# Patient Record
Sex: Female | Born: 1975 | Race: White | Hispanic: No | Marital: Married | Smoking: Former smoker
Health system: Southern US, Community
[De-identification: ages and names within clinical notes are randomized; demographics above are authoritative.]

## PROBLEM LIST (undated history)

## (undated) DIAGNOSIS — T7840XA Allergy, unspecified, initial encounter: Secondary | ICD-10-CM

## (undated) DIAGNOSIS — B379 Candidiasis, unspecified: Secondary | ICD-10-CM

## (undated) DIAGNOSIS — N6452 Nipple discharge: Secondary | ICD-10-CM

## (undated) DIAGNOSIS — F419 Anxiety disorder, unspecified: Secondary | ICD-10-CM

## (undated) DIAGNOSIS — T753XXA Motion sickness, initial encounter: Secondary | ICD-10-CM

## (undated) DIAGNOSIS — Z8619 Personal history of other infectious and parasitic diseases: Secondary | ICD-10-CM

## (undated) DIAGNOSIS — IMO0002 Reserved for concepts with insufficient information to code with codable children: Secondary | ICD-10-CM

## (undated) DIAGNOSIS — Z8659 Personal history of other mental and behavioral disorders: Secondary | ICD-10-CM

## (undated) DIAGNOSIS — I10 Essential (primary) hypertension: Secondary | ICD-10-CM

## (undated) DIAGNOSIS — Z789 Other specified health status: Secondary | ICD-10-CM

## (undated) DIAGNOSIS — Z800 Family history of malignant neoplasm of digestive organs: Secondary | ICD-10-CM

## (undated) HISTORY — DX: Family history of malignant neoplasm of digestive organs: Z80.0

## (undated) HISTORY — DX: Personal history of other mental and behavioral disorders: Z86.59

## (undated) HISTORY — DX: Allergy, unspecified, initial encounter: T78.40XA

## (undated) HISTORY — DX: Essential (primary) hypertension: I10

## (undated) HISTORY — PX: BREAST SURGERY: SHX581

## (undated) HISTORY — DX: Personal history of other infectious and parasitic diseases: Z86.19

## (undated) HISTORY — DX: Reserved for concepts with insufficient information to code with codable children: IMO0002

## (undated) HISTORY — PX: NO PAST SURGERIES: SHX2092

## (undated) HISTORY — DX: Anxiety disorder, unspecified: F41.9

## (undated) HISTORY — DX: Candidiasis, unspecified: B37.9

---

## 1998-01-16 DIAGNOSIS — IMO0002 Reserved for concepts with insufficient information to code with codable children: Secondary | ICD-10-CM

## 1998-01-16 DIAGNOSIS — R87619 Unspecified abnormal cytological findings in specimens from cervix uteri: Secondary | ICD-10-CM

## 1998-01-16 HISTORY — DX: Unspecified abnormal cytological findings in specimens from cervix uteri: R87.619

## 1998-01-16 HISTORY — DX: Reserved for concepts with insufficient information to code with codable children: IMO0002

## 2008-09-11 ENCOUNTER — Other Ambulatory Visit: Admission: RE | Admit: 2008-09-11 | Discharge: 2008-09-11 | Payer: Self-pay | Admitting: Family Medicine

## 2009-11-08 ENCOUNTER — Other Ambulatory Visit: Admission: RE | Admit: 2009-11-08 | Discharge: 2009-11-08 | Payer: Self-pay | Admitting: Family Medicine

## 2011-01-17 NOTE — L&D Delivery Note (Signed)
Delivery Note At 10:46 AM a viable female was delivered SVD in McRoberts position, by Dr. Estanislado Pandy (Presentation: OA ).  APGAR: 9, 9 ; weight 7 lb 0.4 oz (3185 g).   Placenta status intact, spontaneous  Anesthesia:  local Episiotomy:  Lacerations: 2nd degree perineal Suture Repair: 3.0 monocryl Est. Blood Loss (mL): 300  Mom to postpartum.  Baby to rooming in.  Sharon Beck 03/24/2011, 11:41 AM

## 2011-02-07 ENCOUNTER — Encounter: Payer: Self-pay | Admitting: *Deleted

## 2011-02-07 ENCOUNTER — Encounter: Payer: BC Managed Care – PPO | Attending: Obstetrics and Gynecology | Admitting: *Deleted

## 2011-02-07 DIAGNOSIS — Z713 Dietary counseling and surveillance: Secondary | ICD-10-CM | POA: Insufficient documentation

## 2011-02-07 DIAGNOSIS — O99810 Abnormal glucose complicating pregnancy: Secondary | ICD-10-CM | POA: Insufficient documentation

## 2011-02-07 NOTE — Progress Notes (Signed)
  Patient was seen on 02/07/2011 for Gestational Diabetes self-management appointment at the [facility]. The following learning objectives were met by the patient during this visit:   States the definition of Gestational Diabetes  States why dietary management is important in controlling blood glucose  Describes the effects each nutrient has on blood glucose levels  Demonstrates ability to create a balanced meal plan  Demonstrates carbohydrate counting   States when to check blood glucose levels  Demonstrates proper blood glucose monitoring techniques  States the effect of stress and exercise on blood glucose levels  States the importance of limiting caffeine and abstaining from alcohol and smoking  Blood glucose monitor given: One Touch Ultra Mini Lot # [id number] x Exp: 5/13 Blood glucose reading: 86  Patient instructed to monitor glucose levels: FBS: 60 - <90 2 hour: <120  *Patient received handouts:  Nutrition Diabetes and Pregnancy  Carbohydrate Counting List  Patient will be seen for follow-up as needed.

## 2011-02-07 NOTE — Patient Instructions (Signed)
Goals:  Check glucose levels per MD as instructed  Follow Gestational Diabetes Diet as instructed  Call for follow-up as needed    

## 2011-03-22 ENCOUNTER — Encounter (INDEPENDENT_AMBULATORY_CARE_PROVIDER_SITE_OTHER): Payer: BC Managed Care – PPO

## 2011-03-22 DIAGNOSIS — Z331 Pregnant state, incidental: Secondary | ICD-10-CM

## 2011-03-24 ENCOUNTER — Inpatient Hospital Stay (HOSPITAL_COMMUNITY)
Admission: AD | Admit: 2011-03-24 | Discharge: 2011-03-26 | DRG: 373 | Disposition: A | Discharge status: Home / Self Care | Payer: BC Managed Care – PPO | Source: Ambulatory Visit | Attending: Obstetrics and Gynecology | Admitting: Obstetrics and Gynecology

## 2011-03-24 ENCOUNTER — Encounter (HOSPITAL_COMMUNITY): Payer: Self-pay

## 2011-03-24 DIAGNOSIS — O99814 Abnormal glucose complicating childbirth: Secondary | ICD-10-CM

## 2011-03-24 DIAGNOSIS — IMO0001 Reserved for inherently not codable concepts without codable children: Secondary | ICD-10-CM

## 2011-03-24 HISTORY — DX: Other specified health status: Z78.9

## 2011-03-24 LAB — COMPREHENSIVE METABOLIC PANEL
BUN: 10 mg/dL (ref 6–23)
Calcium: 8.3 mg/dL — ABNORMAL LOW (ref 8.4–10.5)
GFR calc Af Amer: >90 mL/min (ref >90)
Glucose, Bld: 157 mg/dL — ABNORMAL HIGH (ref 70–99)
Total Protein: 5.8 g/dL — ABNORMAL LOW (ref 6.0–8.3)

## 2011-03-24 LAB — STREP B DNA PROBE: GBS: NEGATIVE

## 2011-03-24 LAB — CBC
HCT: 30.9 % — ABNORMAL LOW (ref 36.0–46.0)
Hemoglobin: 10.6 g/dL — ABNORMAL LOW (ref 12.0–15.0)
Hemoglobin: 10.6 g/dL — ABNORMAL LOW (ref 12.0–15.0)
MCHC: 34.3 g/dL (ref 30.0–36.0)
RBC: 3.45 MIL/uL — ABNORMAL LOW (ref 3.87–5.11)
WBC: 17 10*3/uL — ABNORMAL HIGH (ref 4.0–10.5)

## 2011-03-24 LAB — HIV ANTIBODY (ROUTINE TESTING W REFLEX): HIV: NONREACTIVE

## 2011-03-24 LAB — GLUCOSE, CAPILLARY
Glucose-Capillary: 156 mg/dL — ABNORMAL HIGH (ref 70–99)
Glucose-Capillary: 171 mg/dL — ABNORMAL HIGH (ref 70–99)

## 2011-03-24 LAB — URIC ACID: Uric Acid, Serum: 4.6 mg/dL (ref 2.4–7.0)

## 2011-03-24 LAB — LACTATE DEHYDROGENASE: LDH: 230 U/L (ref 94–250)

## 2011-03-24 LAB — ABO/RH

## 2011-03-24 LAB — ANTIBODY SCREEN: Antibody Screen: NEGATIVE

## 2011-03-24 MED ORDER — SIMETHICONE 80 MG PO CHEW: 80.0000 mg | CHEWABLE_TABLET | ORAL | Status: DC | PRN

## 2011-03-24 MED ORDER — DIBUCAINE 1 % RE OINT
1.0000 "application " | TOPICAL_OINTMENT | RECTAL | Status: DC | PRN
  Filled 2011-03-24 (×2): qty 28

## 2011-03-24 MED ORDER — FLEET ENEMA 7-19 GM/118ML RE ENEM: 1.0000 | ENEMA | RECTAL | Status: DC | PRN

## 2011-03-24 MED ORDER — OXYTOCIN 20 UNITS IN LACTATED RINGERS INFUSION - SIMPLE
125.0000 mL/h | Freq: Once | INTRAVENOUS | Status: AC
  Administered 2011-03-24: 125 mL/h via INTRAVENOUS

## 2011-03-24 MED ORDER — SODIUM CHLORIDE 0.9 % IV SOLN: 250.0000 mL | INTRAVENOUS | Status: DC | PRN

## 2011-03-24 MED ORDER — HYDROCORTISONE ACETATE 25 MG RE SUPP
25.0000 mg | Freq: Four times a day (QID) | RECTAL | Status: DC | PRN
  Filled 2011-03-24: qty 1

## 2011-03-24 MED ORDER — OXYCODONE-ACETAMINOPHEN 5-325 MG PO TABS: 1.0000 | ORAL_TABLET | ORAL | Status: DC | PRN

## 2011-03-24 MED ORDER — CITRIC ACID-SODIUM CITRATE 334-500 MG/5ML PO SOLN: 30.0000 mL | ORAL | Status: DC | PRN

## 2011-03-24 MED ORDER — LANOLIN HYDROUS EX OINT: TOPICAL_OINTMENT | CUTANEOUS | Status: DC | PRN

## 2011-03-24 MED ORDER — TETANUS-DIPHTH-ACELL PERTUSSIS 5-2.5-18.5 LF-MCG/0.5 IM SUSP
0.5000 mL | Freq: Once | INTRAMUSCULAR | Status: DC
  Filled 2011-03-24: qty 0.5

## 2011-03-24 MED ORDER — DIPHENHYDRAMINE HCL 25 MG PO CAPS: 25.0000 mg | ORAL_CAPSULE | Freq: Four times a day (QID) | ORAL | Status: DC | PRN

## 2011-03-24 MED ORDER — OXYTOCIN BOLUS FROM INFUSION
500.0000 mL | Freq: Once | INTRAVENOUS | Status: AC
  Administered 2011-03-24: 500 mL via INTRAVENOUS
  Filled 2011-03-24: qty 1000
  Filled 2011-03-24: qty 500

## 2011-03-24 MED ORDER — WITCH HAZEL-GLYCERIN EX PADS: 1.0000 "application " | MEDICATED_PAD | CUTANEOUS | Status: DC | PRN

## 2011-03-24 MED ORDER — BENZOCAINE-MENTHOL 20-0.5 % EX AERO
1.0000 "application " | INHALATION_SPRAY | CUTANEOUS | Status: DC | PRN
  Filled 2011-03-24: qty 56

## 2011-03-24 MED ORDER — ONDANSETRON HCL 4 MG PO TABS: 4.0000 mg | ORAL_TABLET | ORAL | Status: DC | PRN

## 2011-03-24 MED ORDER — OXYTOCIN 10 UNIT/ML IJ SOLN
INTRAMUSCULAR | Status: AC
  Filled 2011-03-24: qty 2

## 2011-03-24 MED ORDER — IBUPROFEN 600 MG PO TABS
600.0000 mg | ORAL_TABLET | Freq: Four times a day (QID) | ORAL | Status: DC
  Administered 2011-03-24 – 2011-03-26 (×3): 600 mg via ORAL
  Filled 2011-03-24 (×3): qty 1

## 2011-03-24 MED ORDER — LACTATED RINGERS IV SOLN
500.0000 mL | INTRAVENOUS | Status: DC | PRN
  Administered 2011-03-24: 500 mL via INTRAVENOUS

## 2011-03-24 MED ORDER — LIDOCAINE HCL (PF) 1 % IJ SOLN
30.0000 mL | INTRAMUSCULAR | Status: DC | PRN
  Administered 2011-03-24: 30 mL via SUBCUTANEOUS
  Filled 2011-03-24: qty 30

## 2011-03-24 MED ORDER — BENZOCAINE-MENTHOL 20-0.5 % EX AERO
INHALATION_SPRAY | CUTANEOUS | Status: AC
Start: 2011-03-24 — End: 2011-03-25
  Filled 2011-03-24: qty 56

## 2011-03-24 MED ORDER — ZOLPIDEM TARTRATE 5 MG PO TABS: 5.0000 mg | ORAL_TABLET | Freq: Every evening | ORAL | Status: DC | PRN

## 2011-03-24 MED ORDER — SODIUM CHLORIDE 0.9 % IJ SOLN: 3.0000 mL | INTRAMUSCULAR | Status: DC | PRN

## 2011-03-24 MED ORDER — ONDANSETRON HCL 4 MG/2ML IJ SOLN: 4.0000 mg | Freq: Four times a day (QID) | INTRAMUSCULAR | Status: DC | PRN

## 2011-03-24 MED ORDER — ONDANSETRON HCL 4 MG/2ML IJ SOLN: 4.0000 mg | INTRAMUSCULAR | Status: DC | PRN

## 2011-03-24 MED ORDER — SENNOSIDES-DOCUSATE SODIUM 8.6-50 MG PO TABS
2.0000 | ORAL_TABLET | Freq: Every day | ORAL | Status: DC
  Administered 2011-03-24 – 2011-03-25 (×2): 2 via ORAL

## 2011-03-24 MED ORDER — BUTORPHANOL TARTRATE 2 MG/ML IJ SOLN: 1.0000 mg | INTRAMUSCULAR | Status: DC | PRN

## 2011-03-24 MED ORDER — PRENATAL MULTIVITAMIN CH
1.0000 | ORAL_TABLET | Freq: Every day | ORAL | Status: DC
  Administered 2011-03-25 – 2011-03-26 (×2): 1 via ORAL
  Filled 2011-03-24: qty 1

## 2011-03-24 MED ORDER — IBUPROFEN 600 MG PO TABS
600.0000 mg | ORAL_TABLET | Freq: Four times a day (QID) | ORAL | Status: DC | PRN
  Administered 2011-03-24: 600 mg via ORAL
  Filled 2011-03-24: qty 1

## 2011-03-24 MED ORDER — ACETAMINOPHEN 325 MG PO TABS: 650.0000 mg | ORAL_TABLET | ORAL | Status: DC | PRN

## 2011-03-24 MED ORDER — SODIUM CHLORIDE 0.9 % IJ SOLN: 3.0000 mL | Freq: Two times a day (BID) | INTRAMUSCULAR | Status: DC

## 2011-03-24 NOTE — Progress Notes (Signed)

## 2011-03-24 NOTE — Progress Notes (Signed)
Patient states she is having regular contractions. Reports good fetal movement. No leaking with bloody show.

## 2011-03-24 NOTE — Progress Notes (Signed)
UR chart review completed.  

## 2011-03-24 NOTE — Progress Notes (Signed)
Called Lilliard CNM regarding blood sugar order for q4hours. She states to discontinue the order and to do fasting blood sugar in the AM.

## 2011-03-25 LAB — PROTEIN, URINE, 24 HOUR: Protein, 24H Urine: 87 mg/d (ref 50–100)

## 2011-03-25 LAB — CREATININE CLEARANCE, URINE, 24 HOUR
Collection Interval-CRCL: 24 hours
Creatinine Clearance: 151 mL/min — ABNORMAL HIGH (ref 75–115)
Creatinine, 24H Ur: 1281 mg/d (ref 700–1800)
Creatinine, Urine: 44.18 mg/dL

## 2011-03-25 LAB — CBC
Hemoglobin: 10.1 g/dL — ABNORMAL LOW (ref 12.0–15.0)
MCH: 31 pg (ref 26.0–34.0)
RBC: 3.26 MIL/uL — ABNORMAL LOW (ref 3.87–5.11)

## 2011-03-25 NOTE — Progress Notes (Signed)
Comfortable, little bleeding, breast well, not dizzy when up O VSS     Ff sm rubra flow, 1 degree perinal lac intact, nonreactive, no edema to lower legs, -Homan's sign bilaterally A pp day 1lactating P continue care      Hemoglobin & Hematocrit     Component Value Date/Time   HGB 10.1* 03/25/2011 0705   HCT 29.6* 03/25/2011 2952

## 2011-03-26 MED ORDER — IBUPROFEN 600 MG PO TABS: 600.0000 mg | ORAL_TABLET | Freq: Four times a day (QID) | ORAL | Status: AC | PRN

## 2011-03-26 NOTE — H&P (Signed)
Neville Pauls is a 36 y.o. female presenting for c/o of contractions hurting in front with uc, denies srom, or vag bleeding, no ha, visual spots or blurring, no swelling face, hands, ankles, no upper abd pain, with +FM  OB hx GDM diet control  HX depression  Meds: PNV  History OB History    Grav Para Term Preterm Abortions TAB SAB Ect Mult Living   1 1 1  0 0 0 0 0 0 1     Past Medical History  Diagnosis Date  . No pertinent past medical history    Past Surgical History  Procedure Date  . No past surgeries    Family History: family history is negative for Anesthesia problems. Social History:  reports that she quit smoking about 3 years ago. She has never used smokeless tobacco. She reports that she does not drink alcohol or use illicit drugs.  ROS  Dilation: 10 Effacement (%): 100 Station: +2 Exam by:: Judie Petit Shafiq Larch CNM Blood pressure 142/84, pulse 91, temperature 97.8 F (36.6 C), temperature source Oral, resp. rate 18, height 5' 2.5" (1.588 m), weight 186 lb 3.2 oz (84.46 kg), SpO2 100.00%, unknown if currently breastfeeding. Exam Physical Exam tense with uc, lungs clear bilaterally, AP RRR, abd soft, gravid, nt, trace edema lower extremities, DTRs +2 bilaterally, no clonus Fhts category 1, UC mild to mod, vag 4 100 -1 VTX intact US anatomy WNL Prenatal labs: ABO, Rh: O/Positive/-- (03/08 0000) Antibody: Negative (03/08 0000) Rubella: Immune (03/08 0000) RPR: NON REACTIVE (03/08 1035)  HBsAg: Negative (03/08 0000)  HIV: Non-reactive (03/08 0000)  GBS: Negative (03/08 0000)  1 gtt 135, # gtt one elevation Assessment/Plan: 40 1/7 week IUP GBS neg Early labor P plans water birth, routine admission, collaboration with Dr.Rivard at [facility].   Hisae Decoursey 03/26/2011, 2:37 PM  Late entry from 03/24/2011

## 2011-03-26 NOTE — Discharge Summary (Signed)
Physician Discharge Summary  Patient ID: Sharon Beck MRN: [id number] DOB/AGE: 34-Jul-1977 34 y.o.  Admit date: 03/24/2011 Discharge date: 03/26/2011  Admission Diagnoses:term pg active labor  Discharge Diagnoses:  Active Problems:  Vaginal delivery lactating 2 degree perineal lac  Discharged Condition: stable  Hospital Course: active labor, SVD, normal involution, elevated           bp with labor 24 hour protein 87  Consults: None  Significant Diagnostic Studies: labs: PIH WNL  Treatments: IV hydration  Discharge Exam: Blood pressure 142/84, pulse 91, temperature 97.8 F (36.6 C), temperature source Oral, resp. rate 18, height 5' 2.5" (1.588 m), weight 84.46 kg (186 lb 3.2 oz), SpO2 100.00%, unknown if currently breastfeeding. General appearance: alert, cooperative and no distress Extremities: Homans sign is negative, no sign of DVT Incision/Wound: well approximated no redness edema or drainage, FF sm serosa flow, no edema lower legs  Disposition: Final discharge disposition not confirmed  Discharge Orders    Future Appointments: Provider: Department: Dept Phone: Center:   03/29/2011 1:30 PM [facility] [phone] None   03/29/2011 2:00 PM Esmeralda Arthur, MD [facility] [phone] None     Future Orders Please Complete By Expires   Strep B DNA probe      Comments:   This external order was created through the Results Console.   HIV antibody      Comments:   This external order was created through the Results Console.   Rubella antibody, IgM      Comments:   This external order was created through the Results Console.   Hepatitis B surface antigen      Comments:   This external order was created through the Results Console.   RPR      Comments:   This external order was created through the Results Console.   Antibody screen      Comments:   This external order was created through the Results Console.   ABO/Rh      Comments:   This  external order was created through the Results Console.     Medication List  As of 03/26/2011  9:44 AM   STOP taking these medications         calcium carbonate 500 MG chewable tablet      docusate calcium 240 MG capsule         TAKE these medications         calcium carbonate 600 MG Tabs   Commonly known as: OS-CAL   Take 600 mg by mouth daily.      cholecalciferol 400 UNITS Tabs   Commonly known as: VITAMIN D   Take 2,000 Units by mouth daily.      folic acid 400 MCG tablet   Commonly known as: FOLVITE   Take 200 mcg by mouth daily.      ibuprofen 600 MG tablet   Commonly known as: ADVIL,MOTRIN   Take 1 tablet (600 mg total) by mouth every 6 (six) hours as needed for pain.      prenatal multivitamin Tabs   Take 1 tablet by mouth daily.           Follow-up Information    Follow up with [facility] in 6 weeks.         SignedLavera Guise 03/26/2011, 9:44 AM

## 2011-03-26 NOTE — Progress Notes (Signed)
Late entry 03/24/2011 1000 S strong contractions hurting in front OFhts 130s MOd uc q 2-4 mod abd soft between uc Active labor P continue care Lavera Guise, CNM  L

## 2011-03-26 NOTE — Progress Notes (Signed)
03/26/2011  Regarding FMLA paper for Sharon Beck, delivery on March 24, 2011  Lavera Guise, [facility]

## 2011-03-26 NOTE — Progress Notes (Signed)
Late entry  03/24/2011 1040 Called to evaluate by RN pt push with fhts 60-80s decels with SROM lite meconium, Scalp electrode placed easily Vag complete, +2 repositioned to side, then knee chest position with FHTS without recovery,  To lithotomy, Dr. Estanislado Pandy updated 1040 per telephone and on the way, pt pushing with contractions. Lavera Guise, CNM

## 2011-03-26 NOTE — Discharge Instructions (Signed)
Breastfeeding BENEFITS OF BREASTFEEDING For the baby  The first milk (colostrum) helps the baby's digestive system function better.   There are antibodies from the mother in the milk that help the baby fight off infections.   The baby has a lower incidence of asthma, allergies, and SIDS (sudden infant death syndrome).   The nutrients in breast milk are better than formulas for the baby and helps the baby's brain grow better.   Babies who breastfeed have less gas, colic, and constipation.  For the mother  Breastfeeding helps develop a very special bond between mother and baby.   It is more convenient, always available at the correct temperature and cheaper than formula feeding.   It burns calories in the mother and helps with losing weight that was gained during pregnancy.   It makes the uterus contract back down to normal size faster and slows bleeding following delivery.   Breastfeeding mothers have a lower risk of developing breast cancer.  NURSE FREQUENTLY  A healthy, full-term baby may breastfeed as often as every hour or space his or her feedings to every 3 hours.   How often to nurse will vary from baby to baby. Watch your baby for signs of hunger, not the clock.   Nurse as often as the baby requests, or when you feel the need to reduce the fullness of your breasts.   Awaken the baby if it has been 3 to 4 hours since the last feeding.   Frequent feeding will help the mother make more milk and will prevent problems like sore nipples and engorgement of the breasts.  BABY'S POSITION AT THE BREAST  Whether lying down or sitting, be sure that the baby's tummy is facing your tummy.   Support the breast with 4 fingers underneath the breast and the thumb above. Make sure your fingers are well away from the nipple and baby's mouth.   Stroke the baby's lips and cheek closest to the breast gently with your finger or nipple.   When the baby's mouth is open wide enough, place all  of your nipple and as much of the dark area around the nipple as possible into your baby's mouth.   Pull the baby in close so the tip of the nose and the baby's cheeks touch the breast during the feeding.  FEEDINGS  The length of each feeding varies from baby to baby and from feeding to feeding.   The baby must suck about 2 to 3 minutes for your milk to get to him or her. This is called a "let down." For this reason, allow the baby to feed on each breast as long as he or she wants. Your baby will end the feeding when he or she has received the right balance of nutrients.   To break the suction, put your finger into the corner of the baby's mouth and slide it between his or her gums before removing your breast from his or her mouth. This will help prevent sore nipples.  REDUCING BREAST ENGORGEMENT  In the first week after your baby is born, you may experience signs of breast engorgement. When breasts are engorged, they feel heavy, warm, full, and may be tender to the touch. You can reduce engorgement if you:   Nurse frequently, every 2 to 3 hours. Mothers who breastfeed early and often have fewer problems with engorgement.   Place light ice packs on your breasts between feedings. This reduces swelling. Wrap the ice packs in a   lightweight towel to protect your skin.   Apply moist hot packs to your breast for 5 to 10 minutes before each feeding. This increases circulation and helps the milk flow.   Gently massage your breast before and during the feeding.   Make sure that the baby empties at least one breast at every feeding before switching sides.   Use a breast pump to empty the breasts if your baby is sleepy or not nursing well. You may also want to pump if you are returning to work or or you feel you are getting engorged.   Avoid bottle feeds, pacifiers or supplemental feedings of water or juice in place of breastfeeding.   Be sure the baby is latched on and positioned properly while  breastfeeding.   Prevent fatigue, stress, and anemia.   Wear a supportive bra, avoiding underwire styles.   Eat a balanced diet with enough fluids.  If you follow these suggestions, your engorgement should improve in 24 to 48 hours. If you are still experiencing difficulty, call your lactation consultant or caregiver. IS MY BABY GETTING ENOUGH MILK? Sometimes, mothers worry about whether their babies are getting enough milk. You can be assured that your baby is getting enough milk if:  The baby is actively sucking and you hear swallowing.   The baby nurses at least 8 to 12 times in a 24 hour time period. Nurse your baby until he or she unlatches or falls asleep at the first breast (at least 10 to 20 minutes), then offer the second side.   The baby is wetting 5 to 6 disposable diapers (6 to 8 cloth diapers) in a 24 hour period by 5 to 6 days of age.   The baby is having at least 2 to 3 stools every 24 hours for the first few months. Breast milk is all the food your baby needs. It is not necessary for your baby to have water or formula. In fact, to help your breasts make more milk, it is best not to give your baby supplemental feedings during the early weeks.   The stool should be soft and yellow.   The baby should gain 4 to 7 ounces per week after he is 4 days old.  TAKE CARE OF YOURSELF Take care of your breasts by:  Bathing or showering daily.   Avoiding the use of soaps on your nipples.   Start feedings on your left breast at one feeding and on your right breast at the next feeding.   You will notice an increase in your milk supply 2 to 5 days after delivery. You may feel some discomfort from engorgement, which makes your breasts very firm and often tender. Engorgement "peaks" out within 24 to 48 hours. In the meantime, apply warm moist towels to your breasts for 5 to 10 minutes before feeding. Gentle massage and expression of some milk before feeding will soften your breasts, making  it easier for your baby to latch on. Wear a well fitting nursing bra and air dry your nipples for 10 to 15 minutes after each feeding.   Only use cotton bra pads.   Only use pure lanolin on your nipples after nursing. You do not need to wash it off before nursing.  Take care of yourself by:   Eating well-balanced meals and nutritious snacks.   Drinking milk, fruit juice, and water to satisfy your thirst (about 8 glasses a day).   Getting plenty of rest.   Increasing calcium in   your diet (1200 mg a day).   Avoiding foods that you notice affect the baby in a bad way.  SEEK MEDICAL CARE IF:   You have any questions or difficulty with breastfeeding.   You need help.   You have a hard, red, sore area on your breast, accompanied by a fever of 100.5 F (38.1 C) or more.   Your baby is too sleepy to eat well or is having trouble sleeping.   Your baby is wetting less than 6 diapers per day, by 65 days of age.   Your baby's skin or white part of his or her eyes is more yellow than it was in the hospital.   You feel depressed.  Vaginal Delivery Care After Change your pad on each trip to the bathroom.  Wipe gently with toilet paper during your hospital stay. Always wipe from front to back. A spray bottle with warm tap water could also be used or a towelette if available.  Place your soiled pad and toilet paper in a bathroom wastebasket with a plastic bag liner.  During your hospital stay, save any clots. If you pass a clot while on the toilet, do not flush it. Also, if your vaginal flow seems excessive to you, notify nursing personnel.  The first time you get out of bed after delivery, wait for assistance from a nurse. Do not get up alone at any time if you feel weak or dizzy.  Bend and extend your ankles forcefully so that you feel the calves of your legs get hard. Do this 6 times every hour when you are in bed and awake.  Do not sit with one foot under you, dangle your legs over the edge  of the bed, or maintain a position that hinders the circulation in your legs.  Many women experience after pains for 2 to 3 days after delivery. These after pains are mild uterine contractions. Ask the nurse for a pain medication if you need something for this. Sometimes breastfeeding stimulates after pains; if you find this to be true, ask for the medication  -  hour before the next feeding.  For you and your infant's protection, do not go beyond the door(s) of the obstetric unit. Do not carry your baby in your arms in the hallway. When taking your baby to and from your room, put your baby in the bassinet and push the bassinet.  Mothers may have their babies in their room as much as they desire.  Document Released: 12/31/1999 Document Revised: 12/22/2010 Document Reviewed: 11/30/2006 [facility] Patient Information 2012 [place], [place]. Document Released: 01/02/2005 Document Revised: 12/22/2010 Document Reviewed: 08/17/2008 [facility] Patient Information 2012 [place], [place].

## 2011-03-29 ENCOUNTER — Other Ambulatory Visit: Payer: BC Managed Care – PPO

## 2011-03-29 ENCOUNTER — Encounter: Payer: BC Managed Care – PPO | Admitting: Obstetrics and Gynecology

## 2011-05-05 ENCOUNTER — Ambulatory Visit (INDEPENDENT_AMBULATORY_CARE_PROVIDER_SITE_OTHER): Payer: BC Managed Care – PPO | Admitting: Obstetrics and Gynecology

## 2011-05-05 ENCOUNTER — Encounter: Payer: Self-pay | Admitting: Obstetrics and Gynecology

## 2011-05-05 NOTE — Progress Notes (Signed)
Date of delivery: 03/24/2011 Female Name:Sharon Beck Vaginal delivery:yes Cesarean section:no Tubal ligation:no GDM:no Breast Feeding:yes Bottle Feeding:no Post-Partum Blues:no Abnormal pap:no Normal GU function: yes Normal GI function:yes Returning to work:yes June 10th No BC desired at this time EPDS 4  Doing well.  Birth experience reviewed. Husband had emergency appy in early postpartum time. Son had pyloric stenosis surgery in early neonatal period.  Doing well now. Needs to do Kegels due to poor PC muscle tone. Note for RTW June 10th.

## 2011-05-24 ENCOUNTER — Encounter: Payer: Self-pay | Admitting: Obstetrics and Gynecology

## 2011-08-17 ENCOUNTER — Telehealth: Payer: Self-pay | Admitting: Obstetrics and Gynecology

## 2011-08-17 NOTE — Telephone Encounter (Signed)
Triage/epic 

## 2011-08-17 NOTE — Telephone Encounter (Signed)
TC to pt. States son is 50 old and is breast feeding and pumping. States upper part of rt breast is tender and hard to the touch. States occurred a few days ago and resolved with feeding. 08/16/11 waited longer than usual to pump and breast became full.   No redness. No fever. No flu like sx. Has tried heat and massage. May try alternating heat and cold or cabbage leaves.  Is awaiting call from Lactation consultant at [facility]. Also given  # of Lovenia Kim at [employer]. Pt will call if no improvement or increase in sx.

## 2013-09-11 LAB — HM PAP SMEAR: HM PAP: NEGATIVE

## 2013-11-17 ENCOUNTER — Encounter: Payer: Self-pay | Admitting: Obstetrics and Gynecology

## 2014-01-16 NOTE — L&D Delivery Note (Signed)
Delivery Note  First Stage: Labor onset: 1700 Augmentation: none Analgesia /Anesthesia intrapartum: none SROM at 1912  Second Stage: Complete dilation at 1910 Onset of pushing at 1910 FHR second stage not obtained  Delivery of a viable female at 87 by Sharon Beck, CNM.  No nuchal cord Sharon Beck, CNM at bedside @1913   Cord double clamped after cessation of pulsation, cut by FOB Cord blood sample collected   Collection of cord blood donation n/a Arterial cord blood sample n/a  Third Stage: Placenta delivered via Sharon Beck intact with 3 VC @ 1918 Placenta disposition: routine disposal Uterine tone firm / bleeding small  1st degree laceration identified  Anesthesia for repair: local Repaired with 2-0 Vicryl Est. Blood Loss (mL): 511  Complications: precipitous delivery  Mom to postpartum.  Baby to Couplet care / Skin to Skin.  Newborn: Birth Weight: pending  Apgar Scores: 8/9 Feeding planned: breast  Julianne Handler, N MSN, CNM 04/06/2014, 8:19 PM

## 2014-02-10 ENCOUNTER — Encounter: Payer: Self-pay | Admitting: *Deleted

## 2014-02-10 ENCOUNTER — Encounter: Payer: 59 | Attending: Obstetrics and Gynecology | Admitting: *Deleted

## 2014-02-10 VITALS — Ht 65.0 in | Wt 183.3 lb

## 2014-02-10 DIAGNOSIS — Z3A00 Weeks of gestation of pregnancy not specified: Secondary | ICD-10-CM | POA: Insufficient documentation

## 2014-02-10 DIAGNOSIS — Z713 Dietary counseling and surveillance: Secondary | ICD-10-CM | POA: Insufficient documentation

## 2014-02-10 DIAGNOSIS — O24410 Gestational diabetes mellitus in pregnancy, diet controlled: Secondary | ICD-10-CM | POA: Diagnosis not present

## 2014-02-10 NOTE — Progress Notes (Signed)
  Patient was seen on 02/10/14 for Gestational Diabetes self-management . The following learning objectives were met by the patient :   States the definition of Gestational Diabetes  States why dietary management is important in controlling blood glucose  Describes the effects of carbohydrates on blood glucose levels  Demonstrates ability to create a balanced meal plan  Demonstrates carbohydrate counting   States when to check blood glucose levels  Demonstrates proper blood glucose monitoring techniques  States the effect of stress and exercise on blood glucose levels  States the importance of limiting caffeine and abstaining from alcohol and smoking  Plan:  Aim for 2 Carb Choices per meal (30 grams) +/- 1 either way for breakfast Aim for 3 Carb Choices per meal (45 grams) +/- 1 either way from lunch and dinner Aim for 1-2 Carbs per snack Begin reading food labels for Total Carbohydrate and sugar grams of foods Consider  increasing your activity level by walking daily as tolerated Begin checking BG before breakfast and 1-2 hours after first bit of breakfast, lunch and dinner after  as directed by MD  Take medication  as directed by MD  Blood glucose monitor given: [employer] Contour Next which is Formulary for [employer] Was using previous prior One Touch Ultra Mini testing BID  Patient instructed to monitor glucose levels: FBS: 60 - <90 2 hour: <120  Patient received the following handouts:  Nutrition Diabetes and Pregnancy  Carbohydrate Counting List  Meal Planning worksheet  Patient will be seen for follow-up as needed.

## 2014-02-18 ENCOUNTER — Ambulatory Visit: Payer: 59

## 2014-04-06 ENCOUNTER — Encounter (HOSPITAL_COMMUNITY): Payer: Self-pay | Admitting: *Deleted

## 2014-04-06 ENCOUNTER — Inpatient Hospital Stay (HOSPITAL_COMMUNITY)
Admission: AD | Admit: 2014-04-06 | Discharge: 2014-04-08 | DRG: 775 | Disposition: A | Discharge status: Home / Self Care | Payer: 59 | Source: Ambulatory Visit | Attending: Obstetrics and Gynecology | Admitting: Obstetrics and Gynecology

## 2014-04-06 DIAGNOSIS — Z3483 Encounter for supervision of other normal pregnancy, third trimester: Secondary | ICD-10-CM | POA: Diagnosis present

## 2014-04-06 DIAGNOSIS — O09523 Supervision of elderly multigravida, third trimester: Secondary | ICD-10-CM

## 2014-04-06 DIAGNOSIS — O24420 Gestational diabetes mellitus in childbirth, diet controlled: Principal | ICD-10-CM | POA: Diagnosis present

## 2014-04-06 DIAGNOSIS — Z3A40 40 weeks gestation of pregnancy: Secondary | ICD-10-CM | POA: Diagnosis present

## 2014-04-06 MED ORDER — AMOXICILLIN 500 MG PO CAPS
500.0000 mg | ORAL_CAPSULE | Freq: Four times a day (QID) | ORAL | Status: DC
  Administered 2014-04-06 – 2014-04-07 (×5): 500 mg via ORAL
  Filled 2014-04-06 (×7): qty 1

## 2014-04-06 MED ORDER — OXYCODONE-ACETAMINOPHEN 5-325 MG PO TABS: 1.0000 | ORAL_TABLET | ORAL | Status: DC | PRN

## 2014-04-06 MED ORDER — ONDANSETRON HCL 4 MG/2ML IJ SOLN: 4.0000 mg | INTRAMUSCULAR | Status: DC | PRN

## 2014-04-06 MED ORDER — PRENATAL MULTIVITAMIN CH
1.0000 | ORAL_TABLET | Freq: Every day | ORAL | Status: DC
  Administered 2014-04-07: 1 via ORAL
  Filled 2014-04-06: qty 1

## 2014-04-06 MED ORDER — SIMETHICONE 80 MG PO CHEW: 80.0000 mg | CHEWABLE_TABLET | ORAL | Status: DC | PRN

## 2014-04-06 MED ORDER — LANOLIN HYDROUS EX OINT: TOPICAL_OINTMENT | CUTANEOUS | Status: DC | PRN

## 2014-04-06 MED ORDER — OXYCODONE-ACETAMINOPHEN 5-325 MG PO TABS
2.0000 | ORAL_TABLET | ORAL | Status: DC | PRN
  Filled 2014-04-06: qty 2

## 2014-04-06 MED ORDER — IBUPROFEN 600 MG PO TABS
600.0000 mg | ORAL_TABLET | Freq: Four times a day (QID) | ORAL | Status: DC
  Administered 2014-04-06 – 2014-04-07 (×2): 600 mg via ORAL
  Filled 2014-04-06 (×4): qty 1

## 2014-04-06 MED ORDER — ACETAMINOPHEN 325 MG PO TABS: 650.0000 mg | ORAL_TABLET | ORAL | Status: DC | PRN

## 2014-04-06 MED ORDER — LIDOCAINE HCL (PF) 1 % IJ SOLN
INTRAMUSCULAR | Status: AC
  Filled 2014-04-06: qty 30

## 2014-04-06 MED ORDER — WITCH HAZEL-GLYCERIN EX PADS
1.0000 "application " | MEDICATED_PAD | CUTANEOUS | Status: DC | PRN
  Administered 2014-04-06: 1 via TOPICAL

## 2014-04-06 MED ORDER — SENNOSIDES-DOCUSATE SODIUM 8.6-50 MG PO TABS
2.0000 | ORAL_TABLET | ORAL | Status: DC
  Administered 2014-04-06 – 2014-04-07 (×2): 2 via ORAL
  Filled 2014-04-06 (×2): qty 2

## 2014-04-06 MED ORDER — DIPHENHYDRAMINE HCL 25 MG PO CAPS: 25.0000 mg | ORAL_CAPSULE | Freq: Four times a day (QID) | ORAL | Status: DC | PRN

## 2014-04-06 MED ORDER — TETANUS-DIPHTH-ACELL PERTUSSIS 5-2.5-18.5 LF-MCG/0.5 IM SUSP: 0.5000 mL | Freq: Once | INTRAMUSCULAR | Status: DC

## 2014-04-06 MED ORDER — ONDANSETRON HCL 4 MG PO TABS: 4.0000 mg | ORAL_TABLET | ORAL | Status: DC | PRN

## 2014-04-06 MED ORDER — BENZOCAINE-MENTHOL 20-0.5 % EX AERO
1.0000 "application " | INHALATION_SPRAY | CUTANEOUS | Status: DC | PRN
  Administered 2014-04-06: 1 via TOPICAL
  Filled 2014-04-06: qty 56

## 2014-04-06 MED ORDER — DIBUCAINE 1 % RE OINT
1.0000 "application " | TOPICAL_OINTMENT | RECTAL | Status: DC | PRN
  Administered 2014-04-06: 1 via RECTAL
  Filled 2014-04-06: qty 28

## 2014-04-06 MED ORDER — MEASLES, MUMPS & RUBELLA VAC ~~LOC~~ INJ
0.5000 mL | INJECTION | Freq: Once | SUBCUTANEOUS | Status: DC
Start: 2014-04-07 — End: 2014-04-07

## 2014-04-06 NOTE — H&P (Signed)
  OB ADMISSION/ HISTORY & PHYSICAL:  Admission Date: 04/06/2014  7:11 PM  Admit Diagnosis: 40.[redacted] weeks gestation, active labor, advanced dilation  Sharon Beck is a 39 y.o. female presenting for precipitous labor and delivery.  Prenatal History: G2P1001   EDC:04/06/2014, by LMP  Prenatal care at [facility]  Primary Ob Provider: Laury Deep, CNM Prenatal course complicated by [id number] controlled, and low-lying placenta resolved at 29 wks,   Prenatal Labs: ABO, Rh:   O Pos Antibody:  Neg Rubella:   Immune RPR:   NR HBsAg:   Neg HIV:   Neg GBS:   Neg 1 hr GTT: 147-no 3hr-hx of GDM  Medical / Surgical History :  Past medical history:  Past Medical History  Diagnosis Date  . No pertinent past medical history   . Yeast infection   . History of chicken pox   . Abnormal Pap smear 2000  . H/O: depression   . Gestational diabetes mellitus, antepartum      Past surgical history:  Past Surgical History  Procedure Laterality Date  . No past surgeries      Family History:  Family History  Problem Relation Age of Onset  . Anesthesia problems Neg Hx   . Diabetes Other   . Hyperlipidemia Other   . Hypertension Other      Social History:  reports that she quit smoking about 6 years ago. She has never used smokeless tobacco. She reports that she does not drink alcohol or use illicit drugs.  Allergies: Aspirin   Current Medications at time of admission:  Prior to Admission medications   Medication Sig Start Date End Date Taking? Authorizing Provider  calcium carbonate (OS-CAL) 600 MG TABS Take 600 mg by mouth daily.    Historical Provider, MD  cholecalciferol (VITAMIN D) 400 UNITS TABS Take 2,000 Units by mouth daily.    Historical Provider, MD  Docusate Sodium (COLACE PO) Take by mouth.    Historical Provider, MD  folic acid (FOLVITE) 097 MCG tablet Take 200 mcg by mouth daily.    Historical Provider, MD  Prenatal Vit-Fe Fumarate-FA (PRENATAL  MULTIVITAMIN) TABS Take 1 tablet by mouth daily.    Historical Provider, MD     Review of Systems: +lochia, small +cramping +perineal burning  Physical Exam:  VS: Blood pressure 142/83, pulse 77, temperature 98.4 F (36.9 C), temperature source Oral, resp. rate 16, currently breastfeeding.  General: alert and oriented, appears mildly uncomfortable Abdomen: soft and non-tender, non-distended Extremities: no edema Genitalia / VE:  small lochia rubra, placenta undelivered  Assessment: S/p precipitous SVD @40  wks Third stage of labor  Plan:  Admit, delivery of placenta, repair of perineal laceration, routine postpartum orders Dr. Ronita Hipps notified of admission / plan of care   Graciela Husbands MSN, CNM 04/06/2014, 8:06 PM

## 2014-04-06 NOTE — MAU Note (Signed)
Pt escorted to MAU from car in [facility] with urge to push, SVD @ 0712.  Rockwell Alexandria CNM in attendance.

## 2014-04-07 ENCOUNTER — Encounter (HOSPITAL_COMMUNITY): Payer: Self-pay

## 2014-04-07 LAB — CBC
HCT: 36.6 % (ref 36.0–46.0)
Hemoglobin: 12.7 g/dL (ref 12.0–15.0)
MCH: 31.9 pg (ref 26.0–34.0)
MCHC: 34.7 g/dL (ref 30.0–36.0)
MCV: 92 fL (ref 78.0–100.0)
Platelets: 177 10*3/uL (ref 150–400)
RBC: 3.98 MIL/uL (ref 3.87–5.11)
RDW: 12.8 % (ref 11.5–15.5)
WBC: 12.4 10*3/uL — AB (ref 4.0–10.5)

## 2014-04-07 NOTE — Progress Notes (Signed)
Patient ID: Veronda Gabor, female   DOB: 12/01/1975, 39 y.o.   MRN: [id number] PPD # 1 SVD / Precipitous Vaginal Delivery - female  S:  Reports feeling well             Tolerating po/ No nausea or vomiting             Bleeding is light             Pain controlled with ibuprofen (OTC)             Up ad lib / ambulatory, mild complaints of joint pain in RT ankle - able to bear weight / voiding without difficulties    Newborn  Information for the patient's newborn:  Sinclair Ship, Girl Kaisy [[id number]]  female  breast feeding     O:  A & O x 3, in no apparent distress              VS:  Filed Vitals:   04/06/14 2200 04/06/14 2300 04/07/14 0205 04/07/14 0600  BP: 142/79  121/69 136/79  Pulse: 72  71 75  Temp: 98.8 F (37.1 C)  98.3 F (36.8 C) 97.6 F (36.4 C)  TempSrc: Oral  Oral   Resp: 18  18 18   Height:  5\' 2"  (1.575 m)    Weight:  74.844 kg (165 lb)    SpO2: 99%  98%     LABS:  Recent Labs  04/07/14 0555  WBC 12.4*  HGB 12.7  HCT 36.6  PLT 177    Blood type:   O Positive  Rubella:   Immune  I&O: I/O last 3 completed shifts: In: -  Out: 200 [Blood:200]             Lungs: Clear and unlabored  Heart: regular rate and rhythm / no murmurs  Abdomen: soft, non-tender, non-distended              Fundus: firm, non-tender, U-1  Perineum: 1st degree repair healing well, mild edema  Lochia: minimal  Extremities: trace edema, no calf pain or tenderness, no Homans    A/P: PPD # 1  39 y.o., [id number]   Principal Problem:    Postpartum care following vaginal delivery (3/21)  Active Problems:    Precipitate labor, with delivery  Ankle Pain, right   Doing well - stable status  Routine post partum orders  Apply ankle brace - wear while up ambulating and prn  Anticipate discharge tomorrow    Laury Deep, M, MSN, CNM 04/07/2014, 9:42 AM

## 2014-04-07 NOTE — Lactation Note (Signed)
This note was copied from the chart of Girl Liliah Dorian. Lactation Consultation Note  Patient Name: Girl Sarabeth Benton LKGMW'N Date: 04/07/2014 Reason for consult: Initial assessment Mom reports some mild nipple tenderness, no breakdown observed. Care for sore nipples reviewed. Comfort gels given with instructions. Mom is experienced breast feeder but asks LC to observe latch due to tenderness. Baby tucking lower lip. Demonstrated how to bring bottom lip down and Mom reports less discomfort. Nipple was round when baby came off the breast. Reviewed postioning with Mom to obtain more depth with latch. Encouraged to BF with feeding ques, 8-12 times in 24 hours. Lactation brochure left for review, advised of OP services and support group. Encouraged to call for questions/concerns or if needs assist.   Maternal Data Has patient been taught Hand Expression?: Yes (Mom reports she was taught hand expression by RN) Does the patient have breastfeeding experience prior to this delivery?: Yes  Feeding Feeding Type: Breast Fed Length of feed: 20 min  LATCH Score/Interventions Latch: Grasps breast easily, tongue down, lips flanged, rhythmical sucking.  Audible Swallowing: A few with stimulation  Type of Nipple: Everted at rest and after stimulation  Comfort (Breast/Nipple): Soft / non-tender     Hold (Positioning): Assistance needed to correctly position infant at breast and maintain latch. Intervention(s): Breastfeeding basics reviewed;Support Pillows;Position options;Skin to skin  LATCH Score: 8  Lactation Tools Discussed/Used     Consult Status Consult Status: Follow-up Date: 04/08/14 Follow-up type: In-patient    Katrine Coho 04/07/2014, 1:40 PM

## 2014-04-08 MED ORDER — IBUPROFEN 600 MG PO TABS: 600.0000 mg | ORAL_TABLET | Freq: Four times a day (QID) | ORAL | Status: DC

## 2014-04-08 NOTE — Discharge Summary (Signed)
Obstetric Discharge Summary Reason for Admission: onset of labor Prenatal Course: A1GDM-well controlled  Intrapartum Procedures: spontaneous vaginal delivery Postpartum Procedures: none Complications-Operative and Postpartum: 1st degree perineal laceration HEMOGLOBIN  Date Value Ref Range Status  04/07/2014 12.7 12.0 - 15.0 g/dL Final   HCT  Date Value Ref Range Status  04/07/2014 36.6 36.0 - 46.0 % Final    Physical Exam:  General: alert and cooperative Lochia: appropriate Uterine Fundus: firm Incision: healing well, no significant drainage, no dehiscence, no significant erythema DVT Evaluation: No evidence of DVT seen on physical exam. Negative Homan's sign. No cords or calf tenderness. No significant calf/ankle edema.  Discharge Diagnoses: Term Pregnancy-delivered; A1GDM, delivered  Discharge Information: Date: 04/08/2014 Activity: pelvic rest Diet: routine Medications: PNV and Ibuprofen Condition: stable Instructions: refer to practice specific booklet Discharge to: home Follow-up Information    Follow up with Laury Deep, M, CNM. Schedule an appointment as soon as possible for a visit in 6 weeks.   Specialty:  Obstetrics and Gynecology   Contact information:   [place] [id number] [phone]       Newborn Data: Live born female on 04/06/14 Birth Weight: 6 lb 11.9 oz (3060 g) APGAR: 8, 9  Home with mother.  Scotty Weigelt, [place], N 04/08/2014, 9:14 AM

## 2014-04-08 NOTE — Progress Notes (Signed)
Patient stated that she is tiered of nurses from day shift and night shift coming in and out of the rooms assessing mom and baby, she stated she is not able to get any sleep because of the constant care. I stated to mom that I can cluster care for her and baby during night shift to assist with getting sleep but even with clustered care mom was still not satisfied.  Mom stated that she wants to sleep throughout the night and morning and asked if we did not disturb her.  Mom also stated that she did not want night shift to update feeding chart or take vitals because it will be disruptive because the baby is currently sleeping, she stated she wanted dayshift to update feeding and continue with care.

## 2014-04-08 NOTE — Lactation Note (Signed)
This note was copied from the chart of Sharon Rillie Riffel. Lactation Consultation Note  Mother's nipples are bruised and has comfort gels. Erasmo Downer RN assisted mother in achieving a deeper latch and chin tug prior to entering the room. Baby recently bf for 20 min. Assessed baby's mouth and she has good tongue movement and a strong suck. Encouraged mother to continue to work on deep latch, wear comfort gels and apply ebm. Reviewed engorgement care and monitoring voids/stools.   Discussed how to get UMR breast pump and suggest mother attend support group.  Patient Name: Sharon Beck [id number]'A Date: 04/08/2014 Reason for consult: Follow-up assessment   Maternal Data    Feeding Feeding Type: Breast Fed  LATCH Score/Interventions Latch: Grasps breast easily, tongue down, lips flanged, rhythmical sucking. Intervention(s): Adjust position;Assist with latch;Breast compression  Audible Swallowing: Spontaneous and intermittent Intervention(s): Hand expression  Type of Nipple: Everted at rest and after stimulation  Comfort (Breast/Nipple): Filling, red/small blisters or bruises, mild/mod discomfort  Problem noted: Mild/Moderate discomfort Interventions (Mild/moderate discomfort): Hand expression;Comfort gels  Hold (Positioning): Assistance needed to correctly position infant at breast and maintain latch. Intervention(s): Breastfeeding basics reviewed;Support Pillows;Position options;Skin to skin  LATCH Score: 8  Lactation Tools Discussed/Used     Consult Status Consult Status: Complete    Carlye Grippe 04/08/2014, 10:00 AM

## 2014-04-08 NOTE — Progress Notes (Signed)
PPD #2- SVD  Subjective:   Reports feeling well-tired-interrupted many times during night, ready for discharge Tolerating po/ No nausea or vomiting Bleeding is light Pain controlled with Motrin Up ad lib / ambulatory / voiding without problems Newborn: breastfeeding-cluster feeding during night     Objective:   VS: VS:  Filed Vitals:   04/07/14 0205 04/07/14 0600 04/07/14 1900 04/07/14 2337  BP: 121/69 136/79 130/84 128/80  Pulse: 71 75 80 84  Temp: 98.3 F (36.8 C) 97.6 F (36.4 C) 98 F (36.7 C) 98.2 F (36.8 C)  TempSrc: Oral  Oral Oral  Resp: 18 18 18 16   Height:      Weight:      SpO2: 98%   99%    LABS:  Recent Labs  04/07/14 0555  WBC 12.4*  HGB 12.7  PLT 177   Blood type:  O pos Rubella:   Immune               I&O: Intake/Output      03/22 0701 - 03/23 0700 03/23 0701 - 03/24 0700   Blood     Total Output       Net              Physical Exam: Alert and oriented X3 Abdomen: soft, non-tender, non-distended  Fundus: firm, non-tender, U-2 Perineum: Well approximated, no significant erythema, edema, or drainage; healing well. Lochia: small Extremities: No edema, no calf pain or tenderness    Assessment: PPD #2  G2P2002/ S/P:spontaneous vaginal, 1st degree laceration A1GDM, delivered Doing well - stable for discharge home   Plan: Discharge home RX's:  Ibuprofen 600mg  po Q 6 hrs prn pain #30 Refill x 0 Routine pp visit in 6wks and GTT 6-12 wks postpartum [facility] booklet given    Julianne Handler, N MSN, CNM 04/08/2014, 9:11 AM

## 2014-04-08 NOTE — Progress Notes (Signed)
Per nurse patient does not want vital signs taken this morning.

## 2014-04-17 ENCOUNTER — Other Ambulatory Visit: Payer: Self-pay | Admitting: *Deleted

## 2014-04-17 NOTE — Patient Outreach (Signed)
Attempted to reach patient to determine if she will need to remain in the [facility] program. She enrolled in the program for gestational diabetes and gave birth to a baby girl on 04/06/14. The baby weighed 6 lbs 11.9 oz. Left message on home phone requesting that she contact this RNCM to advise of program status intentions. Await return call. Barrington Ellison RN,CCM,CDE [place] Management Coordinator Office Phone [phone]

## 2014-06-09 ENCOUNTER — Other Ambulatory Visit: Payer: Self-pay | Admitting: *Deleted

## 2014-06-09 NOTE — Patient Outreach (Signed)
Returned call to Sharon Beck after she left a message on 5/23 stating that she failed her 6 week postpartum oral glucose tolerance test and she has been advised by her primary care physician to attend additional nutritional counseling as [name] does not want to take medication to treat her blood sugars. Requested [name] return call to this RNCM so we can discuss benefits and continued participation in the Link To [place] RN,CCM,CDE [place] Management Coordinator [facility] Office Phone [phone] Office Fax [phone]

## 2014-06-22 ENCOUNTER — Other Ambulatory Visit: Payer: Self-pay | Admitting: *Deleted

## 2014-06-22 NOTE — Patient Outreach (Signed)
Call returned to Sharon Beck to schedule [facility] appointment. She was previously in the program for gestational diabetes and did not pass her 6 week post partum oral glucose tolerance test so she wants to continue in the [facility] program. Left message on her voice mail requesting that she call [place] Management Assistant at [phone] to schedule an appointment for sometime in July or August since she has [facility]. Barrington Ellison RN,CCM,CDE [place] Management Coordinator [facility] Office Phone [phone] Office Fax [phone]

## 2014-06-25 ENCOUNTER — Encounter: Payer: Self-pay | Admitting: *Deleted

## 2014-06-25 ENCOUNTER — Encounter: Payer: 59 | Attending: Family Medicine | Admitting: *Deleted

## 2014-06-25 VITALS — Ht 64.0 in | Wt 164.3 lb

## 2014-06-25 DIAGNOSIS — Z713 Dietary counseling and surveillance: Secondary | ICD-10-CM | POA: Insufficient documentation

## 2014-06-25 DIAGNOSIS — E119 Type 2 diabetes mellitus without complications: Secondary | ICD-10-CM | POA: Diagnosis not present

## 2014-06-25 NOTE — Patient Instructions (Signed)
OK to have fruit in the morning for breakfast  Plan:  Aim for 3 Carb Choices per meal (45 grams) +/- 1 either way  Aim for 0-15 Carbs per snack if hungry  Include protein in moderation with your meals and snacks continue reading food labels for Total Carbohydrate and Fat Grams of foods Consider  increasing your activity level by walking & ellyptical for 30 minutes daily as tolerated 5 days per week Consider checking BG at alternate times per day to include fasting, pre meal and 2hpp one time per day   May increase your fat intake ( salad dressing, cream cheese .Marland KitchenMarland KitchenMarland KitchenMarland Kitchen)  Follow up with [facility] in November Take good care of those babies :-)

## 2014-06-25 NOTE — Progress Notes (Signed)
Diabetes Self-Management Education  Visit Type:  Follow-up  Appt. Start Time: 0915 Appt. End Time: 1030  06/26/2014  [name], identified by name and date of birth, is a 39 y.o. female with a diagnosis of Diabetes:  .  Other people present during visit:  Patient returns for follow up from GDM. Sharon Beck is now 61 months old. Her OB/GYN performed 2h glucose tolerance test which she "failed" . However her A1c is 5.4%. Her FBS range 78-90 (ADA recommendations 99mg /dl). Two hour post meal readings range 88-140 (ADA recommendations 140mg /dl). Sharon Beck's primary concern is how to increase her calorie intake to support Nursing her baby without impacting her glucose. I consulted Bev Paddock, LCD, CDE for nutritional recommendations.  ASSESSMENT  Height 5\' 4"  (1.626 m), weight 164 lb 4.8 oz (74.526 kg), currently breastfeeding. Body mass index is 28.19 kg/(m^2).   Subsequent Visit Information:  Since your last visit, have you continued or began the use of a meal plan?: Yes How many days a week are you following a meal plan?: 7 Since your last visit have you experienced any weight changes?: Loss (had a baby) Weight Loss (lbs): 19 Since your last visit, are you checking your blood glucose at least once a day?: Yes  Psychosocial:   Patient Belief/Attitude about Diabetes: Motivated to manage diabetes Self-care barriers: None Self-management support: Doctor's office, Family, CDE visits Other persons present: Patient Patient Concerns: Nutrition/Meal planning, Glycemic Control Special Needs: None Preferred Learning Style: No preference indicated Learning Readiness: Change in progress  Complications:   Last HgB A1C per patient/outside source: 5.4 mg/dL How often do you check your blood sugar?: 3-4 times/day Fasting Blood glucose range (mg/dL): 70-129 Postprandial Blood glucose range (mg/dL): 70-129 (88-140)  Diet Intake:  Breakfast: oatmeal, milk, nuts / 2 toast, hard boiled egg /   Snack (morning): greek yogurt, almonds / cheese stick, crackers / apple, peanut butter Lunch: salade  Snack (evening): chicken enchilladas, 1/4C rice  Exercise:  Exercise: ADL's  Individualized Plan for Diabetes Self-Management Training:   Learning Objective:  Patient will have a greater understanding of diabetes self-management. Patient education plan per assessed needs and concerns is to attend individual sessions     Education Topics Reviewed with Patient Today:  Role of exercise on diabetes management, blood pressure control and cardiac health. Purpose and frequency of SMBG.  PATIENTS GOALS/Plan (Developed by the patient):  Nutrition: General guidelines for healthy choices and portions discussed Physical Activity: Exercise 3-5 times per week, 30 minutes per day Medications: Not Applicable  Patient Self Evaluation of Goals - Patient rates self as meeting previously set goals:   Nutrition: >75% Physical Activity: < 53% (37 month old baby , plans to resume this month) Medications: Not Applicable Monitoring: >76% Problem Solving: >75% Reducing Risk: >75% Health Coping: >75%    Patient Instructions  OK to have fruit in the morning for breakfast  Plan:  Aim for 3 Carb Choices per meal (45 grams) +/- 1 either way  Aim for 0-15 Carbs per snack if hungry  Include protein in moderation with your meals and snacks continue reading food labels for Total Carbohydrate and Fat Grams of foods Consider  increasing your activity level by walking & ellyptical for 30 minutes daily as tolerated 5 days per week Consider checking BG at alternate times per day to include fasting, pre meal and 2hpp one time per day   May increase your fat intake ( salad dressing, cream cheese .Marland KitchenMarland KitchenMarland KitchenMarland Kitchen)  Follow up with [facility]  in November Take good care of those babies :-)    Expected Outcomes:  Demonstrated interest in learning. Expect positive outcomes  Education material provided: Living Well  with Diabetes, A1C conversion sheet and Meal plan card  If problems or questions, patient to contact team via:  Phone  Future DSME appointment: - PRN

## 2014-07-14 ENCOUNTER — Ambulatory Visit: Payer: 59 | Admitting: Dietician

## 2014-11-19 ENCOUNTER — Other Ambulatory Visit: Payer: Self-pay | Admitting: *Deleted

## 2014-11-19 ENCOUNTER — Encounter: Payer: Self-pay | Admitting: *Deleted

## 2014-11-19 NOTE — Patient Outreach (Signed)
Spoke with Akya to arrange 6 month [facility] follow up for prediabetes. Sharon Beck states she recently had her Hgb A1C checked and it was 5.3% and her primary care MD told her it was not necessary for her to check her blood sugars. Eulanda also informed this RNCM that she has changed jobs and is no longer employed with [employer] so she would not qualify for continued participation in the [employer]. Will close case and notify Timmya's primary care provider of case closure. Barrington Ellison RN,CCM,CDE [place] Management Coordinator [facility] Office Phone [phone] Office Fax [phone]

## 2015-03-02 MED FILL — TARON-C DHA CAPSULE: 53.5-38-1 | 30 days supply | Qty: 30 | Fill #4

## 2015-10-01 ENCOUNTER — Ambulatory Visit (INDEPENDENT_AMBULATORY_CARE_PROVIDER_SITE_OTHER): Payer: Managed Care, Other (non HMO) | Admitting: Medical

## 2015-10-01 VITALS — BP 120/80 | HR 73 | Temp 98.1°F | Ht 64.75 in | Wt 143.0 lb

## 2015-10-01 DIAGNOSIS — I889 Nonspecific lymphadenitis, unspecified: Secondary | ICD-10-CM

## 2015-10-01 NOTE — Progress Notes (Signed)
Subjective: Chief Complaint  Patient presents with  . new pt    new pt- swollen gland under jaw.    Here as a new patient.  She has an upcoming physical visit with Vickie here.  Here for swollen lump in right neck under mandible.  She noticed this last night, its seemed to swell up and go down a few times in the past 24 hours, but its normal now.  She had fell like she was coming down with illness last week but this resolved.  Currently she feels fine.   Denies fevers, night sweats, weigh loss, no other symptoms.  She is a former smoker, quit 10 years ago, smoked socially for 8 years , on average a 1/2 pack a week while in college.   Has 2 young children in day care currently.   No other aggravating or relieving factors. No other complaint.  Past Medical History:  Diagnosis Date  . Abnormal Pap smear 2000  . Gestational diabetes mellitus, antepartum   . H/O: depression   . History of chicken pox   . No pertinent past medical history   . Yeast infection    Review of Systems Constitutional: -fever, -chills, -sweats, -unexpected weight change,-fatigue ENT: -runny nose, -ear pain, -sore throat Cardiology:  -chest pain, -palpitations, -edema Respiratory: -cough, -shortness of breath, -wheezing Gastroenterology: -abdominal pain, -nausea, -vomiting, -diarrhea, -constipation Hematology: -bleeding or bruising problems Musculoskeletal: -arthralgias, -myalgias, -joint swelling, -back pain Ophthalmology: -vision changes Urology: -dysuria, -difficulty urinating, -hematuria, -urinary frequency, -urgency Neurology: -headache, -weakness, -tingling, -numbness      Objective: BP 120/80   Pulse 73   Temp 98.1 F (36.7 C) (Oral)   Ht 5' 4.75" (1.645 m)   Wt 143 lb (64.9 kg)   BMI 23.98 kg/m   General appearance: alert, no distress, WD/WN, white female HEENT: normocephalic, sclerae anicteric, TMs pearly, nares patent, no discharge or erythema, pharynx normal Oral cavity: MMM, no  lesions Neck: supple, no lymphadenopathy, no thyromegaly, no masses Heart: RRR, normal S1, S2, no murmurs Lungs: CTA bilaterally, no wheezes, rhonchi, or rales Abdomen: +bs, soft, non tender, non distended, no masses, no hepatomegaly, no splenomegaly Pulses: 2+ symmetric, upper and lower extremities, normal cap refill Ext: no edema Skin: no worrisome findings    Assessment:  Encounter Diagnosis  Name Primary?  . Lymphadenitis Yes    Plan: Currently normal exam. discuss that her symptoms were either most likely salivary gland blocked or possible recent viral process causing some transient lymph nodes to be inflamed.   No other worrisome findings today to suggest otherwise.  Reassured.

## 2015-10-15 ENCOUNTER — Encounter: Payer: Self-pay | Admitting: Family Medicine

## 2015-10-15 ENCOUNTER — Ambulatory Visit (INDEPENDENT_AMBULATORY_CARE_PROVIDER_SITE_OTHER): Payer: Managed Care, Other (non HMO) | Admitting: Family Medicine

## 2015-10-15 ENCOUNTER — Other Ambulatory Visit: Payer: Self-pay | Admitting: Family Medicine

## 2015-10-15 VITALS — BP 120/80 | HR 74 | Wt 140.6 lb

## 2015-10-15 DIAGNOSIS — Z8639 Personal history of other endocrine, nutritional and metabolic disease: Secondary | ICD-10-CM | POA: Diagnosis not present

## 2015-10-15 DIAGNOSIS — Z0000 Encounter for general adult medical examination without abnormal findings: Secondary | ICD-10-CM

## 2015-10-15 DIAGNOSIS — Z8349 Family history of other endocrine, nutritional and metabolic diseases: Secondary | ICD-10-CM | POA: Insufficient documentation

## 2015-10-15 DIAGNOSIS — Z8632 Personal history of gestational diabetes: Secondary | ICD-10-CM | POA: Insufficient documentation

## 2015-10-15 DIAGNOSIS — O24429 Gestational diabetes mellitus in childbirth, unspecified control: Secondary | ICD-10-CM

## 2015-10-15 HISTORY — DX: Personal history of gestational diabetes: Z86.32

## 2015-10-15 LAB — COMPREHENSIVE METABOLIC PANEL
ALK PHOS: 51 U/L (ref 33–115)
ALT: 52 U/L — AB (ref 6–29)
AST: 40 U/L — ABNORMAL HIGH (ref 10–30)
Albumin: 4.6 g/dL (ref 3.6–5.1)
BUN: 14 mg/dL (ref 7–25)
CO2: 27 mmol/L (ref 20–31)
CREATININE: 0.68 mg/dL (ref 0.50–1.10)
Calcium: 8.9 mg/dL (ref 8.6–10.2)
Chloride: 105 mmol/L (ref 98–110)
Glucose, Bld: 93 mg/dL (ref 65–99)
POTASSIUM: 4.6 mmol/L (ref 3.5–5.3)
SODIUM: 139 mmol/L (ref 135–146)
Total Bilirubin: 0.6 mg/dL (ref 0.2–1.2)
Total Protein: 7 g/dL (ref 6.1–8.1)

## 2015-10-15 LAB — LIPID PANEL
CHOLESTEROL: 171 mg/dL (ref 125–200)
HDL: 45 mg/dL — ABNORMAL LOW (ref >=46)
LDL Cholesterol: 98 mg/dL (ref <130)
TRIGLYCERIDES: 141 mg/dL (ref <150)
Total CHOL/HDL Ratio: 3.8 Ratio (ref <=5.0)
VLDL: 28 mg/dL (ref <30)

## 2015-10-15 LAB — CBC WITH DIFFERENTIAL/PLATELET
Basophils Absolute: 74 cells/uL (ref 0–200)
Basophils Relative: 2 %
EOS PCT: 4 %
Eosinophils Absolute: 148 cells/uL (ref 15–500)
HCT: 40.2 % (ref 35.0–45.0)
Hemoglobin: 13.7 g/dL (ref 11.7–15.5)
LYMPHS PCT: 50 %
Lymphs Abs: 1850 cells/uL (ref 850–3900)
MCH: 31.8 pg (ref 27.0–33.0)
MCHC: 34.1 g/dL (ref 32.0–36.0)
MCV: 93.3 fL (ref 80.0–100.0)
MPV: 9.7 fL (ref 7.5–12.5)
Monocytes Absolute: 444 cells/uL (ref 200–950)
Monocytes Relative: 12 %
NEUTROS ABS: 1184 {cells}/uL — AB (ref 1500–7800)
Neutrophils Relative %: 32 %
PLATELETS: 202 10*3/uL (ref 140–400)
RBC: 4.31 MIL/uL (ref 3.80–5.10)
RDW: 12.9 % (ref 11.0–15.0)
WBC: 3.7 10*3/uL — AB (ref 4.0–10.5)

## 2015-10-15 LAB — TSH: TSH: 1.23 mIU/L

## 2015-10-15 NOTE — Progress Notes (Signed)
   Subjective:    Patient ID: Sharon Beck, female    DOB: November 28, 1975, 40 y.o.   MRN: [id number]  HPI Chief Complaint  Patient presents with  . consult    get establish.    She is here to establish care with me for primary care. She did see Audelia Acton, PA in our office for an acute complaint recently. She had an enlarged lymph node that resolved.  Has lost 20 lbs intentionally over the past several months.   Previous medical care: [facility] [address], Lanell McNeill.  Last CPE: thinks she is due. Will need an A1C due to history of GDM during both pregnancies.  Cholesterol was high with finger prick done by her employer, would like this rechecked. She is fasting today.  Would like to have labs drawn.  Occasional constipation and intermittent occasional discomfort in rectal area sometimes with a bowel movement but lasts only a few minutes. This is ongoing and unchanged.  Hemorrhoids- external per patient. Does not require medication now. Has used proctofoam in past.  History of vitamin D deficiency and is not taking supplement.   Other providers:  [facility]  Past medical history: gestational diabetes 2000, abnormal pap smear, H/O depression, chicken pox, yeast infection.  Surgeries: none  Family history: father was diagnosed with colon cancer at age 83 stage 2.  HTN, DM parents. Grandmothers with hyperlipidemia. MGM cervical cancer and stroke. Sister - thyroid disease.   Social history: Lives with husband and  has 2 young children in day care.  works at [employer].  Former smoker- quit 10 years ago, 1/2 pack per week. Drinks alcohol 1 glass of wine per week, denies drug use     Reviewed allergies, medications, past medical, surgical, family, and social history.   Review of Systems Pertinent positives and negatives in the history of present illness.     Objective:   Physical Exam BP 120/80   Pulse 74   Wt 140 lb 9.6 oz (63.8 kg)   BMI 23.58 kg/m   Alert and oriented  and in no acute distress. Not otherwise examined.      Assessment & Plan:  Gestational diabetes mellitus (GDM) in childbirth - Plan: Comprehensive metabolic panel, Hemoglobin A1c  History of vitamin D deficiency - Plan: VITAMIN D 25 Hydroxy (Vit-D Deficiency, Fractures)  Family history of thyroid disease in sister - Plan: TSH  Routine general medical examination at a health care facility - Plan: CBC with Differential/Platelet, Comprehensive metabolic panel, TSH, Lipid panel  Discussed that we will keep an eye on her Hgb A1C since she has history of GDM.  History of vitamin D deficiency. Is not taking a supplement currently. Will check vitamin D level.  Family history of colon cancer in father, will plan to send her to GI at age 55.  She is fasting today so labs will be drawn and she will return in the next 2-3 weeks for a CPE.

## 2015-10-16 LAB — HEMOGLOBIN A1C
Hgb A1c MFr Bld: 5.1 % (ref <5.7)
MEAN PLASMA GLUCOSE: 100 mg/dL

## 2015-10-16 LAB — VITAMIN D 25 HYDROXY (VIT D DEFICIENCY, FRACTURES): Vit D, 25-Hydroxy: 33 ng/mL (ref 30–100)

## 2015-10-19 LAB — HEPATITIS PANEL, ACUTE
HCV AB: NEGATIVE
HEP B S AG: NEGATIVE
Hep A IgM: NONREACTIVE
Hep B C IgM: NONREACTIVE

## 2015-10-20 ENCOUNTER — Telehealth: Payer: Self-pay | Admitting: Family Medicine

## 2015-10-20 NOTE — Telephone Encounter (Signed)
Records received from [facility]. Sending back for review.

## 2015-11-03 ENCOUNTER — Encounter: Payer: Self-pay | Admitting: Family Medicine

## 2015-11-03 NOTE — Progress Notes (Signed)
Subjective:    Patient ID: Sharon Beck, female    DOB: 06/16/1975, 40 y.o.   MRN: [id number]  HPI Chief Complaint  Patient presents with  . cpe    cpe   She is here for a CPE. Recently had fasting labs that did show mildly elevated LFTs. Acute hepatitis panel was negative. She is not a drinker and no explanation for this and she would like to have her liver tests repeated today.   Previous medical care: [facility] on [address], Raneshia McNeill.   Occasional constipation and intermittent occasional discomfort in rectal area sometimes with a bowel movement but lasts only a few minutes. This is ongoing and unchanged.  Hemorrhoids- external per patient. Does not require medication now. Has used proctofoam in past.  History of vitamin D deficiency and is not taking supplement.   Other providers:  [facility]- [facility]. Is due for pap next year per patient.   Past medical history: gestational diabetes 2000, abnormal pap smear > 10 years ago, H/O depression but not recently, chicken pox, yeast infection.  Surgeries: none  Family history: father was diagnosed with colon cancer at age 16 stage 2.  HTN, DM parents. Grandmothers with hyperlipidemia. MGM cervical cancer and stroke. Sister - thyroid disease.   Social history: Lives with husband and  has 2 young children in day care.  works at [employer].  Former smoker- quit 10 years ago, 1/2 pack per week. Drinks alcohol 1 glass of wine per month, denies drug use  Diet: relatively clean and healthy.  Excerise: 4-5 days per week.   Immunizations: Tdap up to date, flu shot up to date  Health maintenance:  Mammogram: never Colonoscopy: never, recommend starting at age 50 due to family history Last Gynecological Exam: 2017 at OB[facility] Last Menstrual cycle: 11/04/2015 Pregnancies: 2 Last Dental Exam: twice annually  Last Eye Exam: annually   Wears seatbelt always, uses sunscreen, smoke detectors in home and functioning, does not text  while driving and feels safe in home environment.   Reviewed allergies, medications, past medical, surgical, family, and social history.   Review of Systems Review of Systems Constitutional: -fever, -chills, -sweats, -unexpected weight change,-fatigue ENT: -runny nose, -ear pain, -sore throat Cardiology:  -chest pain, -palpitations, -edema Respiratory: -cough, -shortness of breath, -wheezing Gastroenterology: -abdominal pain, -nausea, -vomiting, -diarrhea, -constipation  Hematology: -bleeding or bruising problems Musculoskeletal: -arthralgias, -myalgias, -joint swelling, -back pain Ophthalmology: -vision changes Urology: -dysuria, -difficulty urinating, -hematuria, -urinary frequency, -urgency Neurology: -headache, -weakness, -tingling, -numbness Skin: no rashes, lesions       Objective:   Physical Exam BP 110/70   Pulse 83   Ht 5' 4.5" (1.638 m)   Wt 139 lb 9.6 oz (63.3 kg)   LMP 11/04/2015   Breastfeeding? No   BMI 23.59 kg/m   General Appearance:    Alert, cooperative, no distress, appears stated age  Head:    Normocephalic, without obvious abnormality, atraumatic  Eyes:    PERRL, conjunctiva/corneas clear, EOM's intact, fundi    benign  Ears:    Normal TM's and external ear canals  Nose:   Nares normal, mucosa normal, no drainage or sinus   tenderness  Throat:   Lips, mucosa, and tongue normal; teeth and gums normal  Neck:   Supple, no lymphadenopathy;  thyroid:  no   enlargement/tenderness/nodules; no carotid   bruit or JVD  Back:    Spine nontender, no curvature, ROM normal, no CVA     tenderness  Lungs:  Clear to auscultation bilaterally without wheezes, rales or     ronchi; respirations unlabored  Chest Wall:    No tenderness or deformity   Heart:    Regular rate and rhythm, S1 and S2 normal, no murmur, rub   or gallop  Breast Exam:    Done at OB/GYN  Abdomen:     Soft, non-tender, nondistended, normoactive bowel sounds,    no masses, no hepatosplenomegaly    Genitalia:    Done at OB/GYN  Rectal:    Not performed due to age<40 and no related complaints  Extremities:   No clubbing, cyanosis or edema  Pulses:   2+ and symmetric all extremities  Skin:   Skin color, texture, turgor normal, no rashes or lesions  Lymph nodes:   Cervical, supraclavicular, and axillary nodes normal  Neurologic:   CNII-XII intact, normal strength, sensation and gait; reflexes 2+ and symmetric throughout          Psych:   Normal mood, affect, hygiene and grooming.    Urinalysis dipstick: spec grav 1.030, blood 1+ (menses).       Assessment & Plan:  Routine general medical examination at a health care facility - Plan: POCT urinalysis dipstick  Elevated LFTs - Plan: Comprehensive metabolic panel  Reviewed records from [facility].  Discussed that she appears to be healthy and overall her labs were fine. Vitamin D is now in normal range and recommend she continue taking a daily vitamin D supplement.  Cholesterol is fine, her HDL is marginally low and she is aware.  Liver enzymes are mildly elevated, acute hepatitis panel negative, she is asymptomatic. Will repeat LFTs today and follow up as appropriate. She has a relatively clean lifestyle.  Discuss safety and health promotion.  She is followed by her OB/GYN also.  Will send her for screening colonoscopy at age 26 due to family history.  Urine positive for blood and she is on her period.  Up to date with immunizations.  Continue with healthy diet and exercise.  Follow up in 1 year pending labs.

## 2015-11-04 ENCOUNTER — Ambulatory Visit (INDEPENDENT_AMBULATORY_CARE_PROVIDER_SITE_OTHER): Payer: Managed Care, Other (non HMO) | Admitting: Family Medicine

## 2015-11-04 ENCOUNTER — Encounter: Payer: Self-pay | Admitting: Family Medicine

## 2015-11-04 VITALS — BP 110/70 | HR 83 | Ht 64.5 in | Wt 139.6 lb

## 2015-11-04 DIAGNOSIS — Z0000 Encounter for general adult medical examination without abnormal findings: Secondary | ICD-10-CM | POA: Diagnosis not present

## 2015-11-04 DIAGNOSIS — R7989 Other specified abnormal findings of blood chemistry: Secondary | ICD-10-CM | POA: Diagnosis not present

## 2015-11-04 DIAGNOSIS — R945 Abnormal results of liver function studies: Secondary | ICD-10-CM

## 2015-11-04 LAB — COMPREHENSIVE METABOLIC PANEL
ALBUMIN: 4.4 g/dL (ref 3.6–5.1)
ALT: 34 U/L — ABNORMAL HIGH (ref 6–29)
AST: 27 U/L (ref 10–30)
Alkaline Phosphatase: 70 U/L (ref 33–115)
BILIRUBIN TOTAL: 0.6 mg/dL (ref 0.2–1.2)
BUN: 13 mg/dL (ref 7–25)
CALCIUM: 9.2 mg/dL (ref 8.6–10.2)
CHLORIDE: 106 mmol/L (ref 98–110)
CO2: 26 mmol/L (ref 20–31)
Creat: 0.73 mg/dL (ref 0.50–1.10)
Glucose, Bld: 95 mg/dL (ref 65–99)
POTASSIUM: 4.3 mmol/L (ref 3.5–5.3)
Sodium: 140 mmol/L (ref 135–146)
Total Protein: 7.1 g/dL (ref 6.1–8.1)

## 2015-11-04 LAB — POCT URINALYSIS DIPSTICK
BILIRUBIN UA: NEGATIVE
GLUCOSE UA: NEGATIVE
KETONES UA: NEGATIVE
Leukocytes, UA: NEGATIVE
Nitrite, UA: NEGATIVE
Protein, UA: NEGATIVE
Urobilinogen, UA: NEGATIVE
pH, UA: 6

## 2015-11-04 NOTE — Patient Instructions (Signed)
You can take vitamin D 800 or 1,000 IU is fine.  Continue getting at least 150 minutes of physical activity per week.  We will call you with lab results.    Preventative Care for Adults - Female      MAINTAIN REGULAR HEALTH EXAMS:  A routine yearly physical is a good way to check in with your primary care provider about your health and preventive screening. It is also an opportunity to share updates about your health and any concerns you have, and receive a thorough all-over exam.   Most health insurance companies pay for at least some preventative services.  Check with your health plan for specific coverages.  WHAT PREVENTATIVE SERVICES DO WOMEN NEED?  Adult women should have their weight and blood pressure checked regularly.   Women age 35 and older should have their cholesterol levels checked regularly.  Women should be screened for cervical cancer with a Pap smear and pelvic exam beginning at either age 33, or 3 years after they become sexually activity.    Breast cancer screening generally begins at age 84 with a mammogram and breast exam by your primary care provider.    Beginning at age 57 and continuing to age 80, women should be screened for colorectal cancer.  Certain people may need continued testing until age 79.  Updating vaccinations is part of preventative care.  Vaccinations help protect against diseases such as the flu.  Osteoporosis is a disease in which the bones lose minerals and strength as we age. Women ages 10 and over should discuss this with their caregivers, as should women after menopause who have other risk factors.  Lab tests are generally done as part of preventative care to screen for anemia and blood disorders, to screen for problems with the kidneys and liver, to screen for bladder problems, to check blood sugar, and to check your cholesterol level.  Preventative services generally include counseling about diet, exercise, avoiding tobacco, drugs,  excessive alcohol consumption, and sexually transmitted infections.    GENERAL RECOMMENDATIONS FOR GOOD HEALTH:  Healthy diet:  Eat a variety of foods, including fruit, vegetables, animal or vegetable protein, such as meat, fish, chicken, and eggs, or beans, lentils, tofu, and grains, such as rice.  Drink plenty of water daily.  Decrease saturated fat in the diet, avoid lots of red meat, processed foods, sweets, fast foods, and fried foods.  Exercise:  Aerobic exercise helps maintain good heart health. At least 30-40 minutes of moderate-intensity exercise is recommended. For example, a brisk walk that increases your heart rate and breathing. This should be done on most days of the week.   Find a type of exercise or a variety of exercises that you enjoy so that it becomes a part of your daily life.  Examples are running, walking, swimming, water aerobics, and biking.  For motivation and support, explore group exercise such as aerobic class, spin class, Zumba, Yoga,or  martial arts, etc.    Set exercise goals for yourself, such as a certain weight goal, walk or run in a race such as a 5k walk/run.  Speak to your primary care provider about exercise goals.  Disease prevention:  If you smoke or chew tobacco, find out from your caregiver how to quit. It can literally save your life, no matter how long you have been a tobacco user. If you do not use tobacco, never begin.   Maintain a healthy diet and normal weight. Increased weight leads to problems with blood  pressure and diabetes.   The Body Mass Index or BMI is a way of measuring how much of your body is fat. Having a BMI above 27 increases the risk of heart disease, diabetes, hypertension, stroke and other problems related to obesity. Your caregiver can help determine your BMI and based on it develop an exercise and dietary program to help you achieve or maintain this important measurement at a healthful level.  High blood pressure causes  heart and blood vessel problems.  Persistent high blood pressure should be treated with medicine if weight loss and exercise do not work.   Fat and cholesterol leaves deposits in your arteries that can block them. This causes heart disease and vessel disease elsewhere in your body.  If your cholesterol is found to be high, or if you have heart disease or certain other medical conditions, then you may need to have your cholesterol monitored frequently and be treated with medication.   Ask if you should have a cardiac stress test if your history suggests this. A stress test is a test done on a treadmill that looks for heart disease. This test can find disease prior to there being a problem.  Menopause can be associated with physical symptoms and risks. Hormone replacement therapy is available to decrease these. You should talk to your caregiver about whether starting or continuing to take hormones is right for you.   Osteoporosis is a disease in which the bones lose minerals and strength as we age. This can result in serious bone fractures. Risk of osteoporosis can be identified using a bone density scan. Women ages 26 and over should discuss this with their caregivers, as should women after menopause who have other risk factors. Ask your caregiver whether you should be taking a calcium supplement and Vitamin D, to reduce the rate of osteoporosis.   Avoid drinking alcohol in excess (more than two drinks per day).  Avoid use of street drugs. Do not share needles with anyone. Ask for professional help if you need assistance or instructions on stopping the use of alcohol, cigarettes, and/or drugs.  Brush your teeth twice a day with fluoride toothpaste, and floss once a day. Good oral hygiene prevents tooth decay and gum disease. The problems can be painful, unattractive, and can cause other health problems. Visit your dentist for a routine oral and dental check up and preventive care every 6-12 months.    Look at your skin regularly.  Use a mirror to look at your back. Notify your caregivers of changes in moles, especially if there are changes in shapes, colors, a size larger than a pencil eraser, an irregular border, or development of new moles.  Safety:  Use seatbelts 100% of the time, whether driving or as a passenger.  Use safety devices such as hearing protection if you work in environments with loud noise or significant background noise.  Use safety glasses when doing any work that could send debris in to the eyes.  Use a helmet if you ride a bike or motorcycle.  Use appropriate safety gear for contact sports.  Talk to your caregiver about gun safety.  Use sunscreen with a SPF (or skin protection factor) of 15 or greater.  Lighter skinned people are at a greater risk of skin cancer. Don't forget to also wear sunglasses in order to protect your eyes from too much damaging sunlight. Damaging sunlight can accelerate cataract formation.   Practice safe sex. Use condoms. Condoms are used for birth control  and to help reduce the spread of sexually transmitted infections (or STIs).  Some of the STIs are gonorrhea (the clap), chlamydia, syphilis, trichomonas, herpes, HPV (human papilloma virus) and HIV (human immunodeficiency virus) which causes AIDS. The herpes, HIV and HPV are viral illnesses that have no cure. These can result in disability, cancer and death.   Keep carbon monoxide and smoke detectors in your home functioning at all times. Change the batteries every 6 months or use a model that plugs into the wall.   Vaccinations:  Stay up to date with your tetanus shots and other required immunizations. You should have a booster for tetanus every 10 years. Be sure to get your flu shot every year, since 5%-20% of the [place] population comes down with the flu. The flu vaccine changes each year, so being vaccinated once is not enough. Get your shot in the fall, before the flu season peaks.   Other  vaccines to consider:  Human Papilloma Virus or HPV causes cancer of the cervix, and other infections that can be transmitted from person to person. There is a vaccine for HPV, and females should get immunized between the ages of 72 and 64. It requires a series of 3 shots.   Pneumococcal vaccine to protect against certain types of pneumonia.  This is normally recommended for adults age 70 or older.  However, adults younger than 38 years old with certain underlying conditions such as diabetes, heart or lung disease should also receive the vaccine.  Shingles vaccine to protect against Varicella Zoster if you are older than age 20, or younger than 38 years old with certain underlying illness.  Hepatitis A vaccine to protect against a form of infection of the liver by a virus acquired from food.  Hepatitis B vaccine to protect against a form of infection of the liver by a virus acquired from blood or body fluids, particularly if you work in health care.  If you plan to travel internationally, check with your local health department for specific vaccination recommendations.  Cancer Screening:  Breast cancer screening is essential to preventive care for women. All women age 5 and older should perform a breast self-exam every month. At age 64 and older, women should have their caregiver complete a breast exam each year. Women at ages 54 and older should have a mammogram (x-ray film) of the breasts. Your caregiver can discuss how often you need mammograms.    Cervical cancer screening includes taking a Pap smear (sample of cells examined under a microscope) from the cervix (end of the uterus). It also includes testing for HPV (Human Papilloma Virus, which can cause cervical cancer). Screening and a pelvic exam should begin at age 73, or 3 years after a woman becomes sexually active. Screening should occur every year, with a Pap smear but no HPV testing, up to age 17. After age 87, you should have a Pap  smear every 3 years with HPV testing, if no HPV was found previously.   Most routine colon cancer screening begins at the age of 75. On a yearly basis, doctors may provide special easy to use take-home tests to check for hidden blood in the stool. Sigmoidoscopy or colonoscopy can detect the earliest forms of colon cancer and is life saving. These tests use a small camera at the end of a tube to directly examine the colon. Speak to your caregiver about this at age 51, when routine screening begins (and is repeated every 5 years unless early  forms of pre-cancerous polyps or small growths are found).

## 2015-11-05 ENCOUNTER — Encounter: Payer: Self-pay | Admitting: Family Medicine

## 2015-11-18 ENCOUNTER — Encounter: Payer: Self-pay | Admitting: Family Medicine

## 2016-10-03 DIAGNOSIS — Z23 Encounter for immunization: Secondary | ICD-10-CM | POA: Diagnosis not present

## 2016-11-05 NOTE — Progress Notes (Signed)
Subjective:    Patient ID: Sharon Beck, female    DOB: 1975-02-28, 41 y.o.   MRN: [id number]  HPI Chief Complaint  Patient presents with  . fasting cpe    fasting cpe, sees obgyn. gets eyes checked yearly   She is here for a complete physical exam.  [facility]   Elevated ALT in the past with negative acute hepatitis panel.  Gestational diabetes history.  Recent weight gain of 11 lbs in the past year. States she stopped doing Weight Watchers and has not been exercising. Her youngest child is now in daycare.   Diet: was doing Weight Watchers but nothing restrictive lately.  Excerise: nothing regular lately   Immunizations: Tdap and flu shot at work   Health maintenance:  Mammogram: never  Colonoscopy: never  Last Gynecological Exam: 2 weeks ago. Normal pap smear per patient. Done at [facility] Exam: twice annually  Last Eye Exam: March 2018  Family history: father was diagnosed with colon cancer at age 57 stage 2. HTN, DM parents. Grandmothers with hyperlipidemia. MGM cervical cancer and stroke. Sister - thyroid disease.   Social history: Lives with husband and has 2 young children in day care. works at [employer]. Husband is a Software engineer.  Former smoker- quit 10 years ago, 1/2 pack per week. Drinks alcohol 1 glass of wine per month, deniesdrug use    Wears seatbelt always, uses sunscreen, smoke detectors in home and functioning, does not text while driving and feels safe in home environment.   Reviewed allergies, medications, past medical, surgical, family, and social history.   Review of Systems Review of Systems Constitutional: -fever, -chills, -sweats, -unexpected weight change,-fatigue ENT: -runny nose, -ear pain, -sore throat Cardiology:  -chest pain, -palpitations, -edema Respiratory: -cough, -shortness of breath, -wheezing Gastroenterology: -abdominal pain, -nausea, -vomiting, -diarrhea, -constipation    Hematology: -bleeding or bruising problems Musculoskeletal: -arthralgias, -myalgias, -joint swelling, -back pain Ophthalmology: -vision changes Urology: -dysuria, -difficulty urinating, -hematuria, -urinary frequency, -urgency Neurology: -headache, -weakness, -tingling, -numbness       Objective:   Physical Exam BP 112/70   Pulse 69   Ht 5\' 5"  (1.651 m)   Wt 151 lb 6.4 oz (68.7 kg)   LMP 10/26/2016   BMI 25.19 kg/m   General Appearance:    Alert, cooperative, no distress, appears stated age  Head:    Normocephalic, without obvious abnormality, atraumatic  Eyes:    PERRL, conjunctiva/corneas clear, EOM's intact, fundi    benign  Ears:    Normal TM's and external ear canals  Nose:   Nares normal, mucosa normal, no drainage or sinus   tenderness  Throat:   Lips, mucosa, and tongue normal; teeth and gums normal  Neck:   Supple, no lymphadenopathy;  thyroid:  no   enlargement/tenderness/nodules; no carotid   bruit or JVD  Back:    Spine nontender, no curvature, ROM normal, no CVA     tenderness  Lungs:     Clear to auscultation bilaterally without wheezes, rales or     ronchi; respirations unlabored  Chest Wall:    No tenderness or deformity   Heart:    Regular rate and rhythm, S1 and S2 normal, no murmur, rub   or gallop  Breast Exam:    Done at OB/GYN  Abdomen:     Soft, non-tender, nondistended, normoactive bowel sounds,    no masses, no hepatosplenomegaly  Genitalia:    Done at OB/GYN  Rectal:    Not  performed due to age<40 and no related complaints  Extremities:   No clubbing, cyanosis or edema  Pulses:   2+ and symmetric all extremities  Skin:   Skin color, texture, turgor normal, no rashes or lesions  Lymph nodes:   Cervical, supraclavicular, and axillary nodes normal  Neurologic:   CNII-XII intact, normal strength, sensation and gait; reflexes 2+ and symmetric throughout          Psych:   Normal mood, affect, hygiene and grooming.    Urinalysis dipstick: negative       Assessment & Plan:  Routine general medical examination at a health care facility - Plan: CBC with Differential/Platelet, Comprehensive metabolic panel, POCT Urinalysis DIP (Proadvantage Device), TSH, Lipid panel  Gestational diabetes mellitus (GDM) in childbirth - Plan: Hemoglobin A1c  Family history of thyroid disease in sister - Plan: TSH  She appears to be doing well. She is concerned about weight gain over the past year but knows how to improve lifestyle to lose weight. She will consider doing Weight Watchers again as this has helped in the past and increase exercise.  Discussed safety and health promotion.  Pap smear done at OB/GYN earlier this month.  Mammogram will be due after January. She will also want to see GI after turning 42 due to father with colon cancer. She will let me know if she needs a referral.  Her ALT has been mildly elevated in the past with a negative acute hepatitis panel.  Check labs and follow up as needed.

## 2016-11-06 ENCOUNTER — Encounter: Payer: Self-pay | Admitting: Family Medicine

## 2016-11-06 ENCOUNTER — Ambulatory Visit (INDEPENDENT_AMBULATORY_CARE_PROVIDER_SITE_OTHER): Payer: 59 | Admitting: Family Medicine

## 2016-11-06 VITALS — BP 112/70 | HR 69 | Ht 65.0 in | Wt 151.4 lb

## 2016-11-06 DIAGNOSIS — Z0000 Encounter for general adult medical examination without abnormal findings: Secondary | ICD-10-CM

## 2016-11-06 DIAGNOSIS — O24429 Gestational diabetes mellitus in childbirth, unspecified control: Secondary | ICD-10-CM | POA: Diagnosis not present

## 2016-11-06 DIAGNOSIS — Z8349 Family history of other endocrine, nutritional and metabolic diseases: Secondary | ICD-10-CM

## 2016-11-06 LAB — POCT URINALYSIS DIP (PROADVANTAGE DEVICE)
Bilirubin, UA: NEGATIVE
Blood, UA: NEGATIVE
Glucose, UA: NEGATIVE mg/dL
Ketones, POC UA: NEGATIVE mg/dL
LEUKOCYTES UA: NEGATIVE
NITRITE UA: NEGATIVE
PROTEIN UA: NEGATIVE mg/dL
Specific Gravity, Urine: 1.015
Urobilinogen, Ur: NEGATIVE
pH, UA: 7 (ref 5.0–8.0)

## 2016-11-06 NOTE — Patient Instructions (Signed)
Make sure you are getting at least 150 minutes of exercise per week.  Take time for YOU!   We will call you with your lab results.   Preventative Care for Adults - Female      MAINTAIN REGULAR HEALTH EXAMS:  A routine yearly physical is a good way to check in with your primary care provider about your health and preventive screening. It is also an opportunity to share updates about your health and any concerns you have, and receive a thorough all-over exam.   Most health insurance companies pay for at least some preventative services.  Check with your health plan for specific coverages.  WHAT PREVENTATIVE SERVICES DO WOMEN NEED?  Adult women should have their weight and blood pressure checked regularly.   Women age 39 and older should have their cholesterol levels checked regularly.  Women should be screened for cervical cancer with a Pap smear and pelvic exam beginning at either age 39, or 3 years after they become sexually activity.    Breast cancer screening generally begins at age 39 with a mammogram and breast exam by your primary care provider.    Beginning at age 11 and continuing to age 39, women should be screened for colorectal cancer.  Certain people may need continued testing until age 33.  Updating vaccinations is part of preventative care.  Vaccinations help protect against diseases such as the flu.  Osteoporosis is a disease in which the bones lose minerals and strength as we age. Women ages 57 and over should discuss this with their caregivers, as should women after menopause who have other risk factors.  Lab tests are generally done as part of preventative care to screen for anemia and blood disorders, to screen for problems with the kidneys and liver, to screen for bladder problems, to check blood sugar, and to check your cholesterol level.  Preventative services generally include counseling about diet, exercise, avoiding tobacco, drugs, excessive alcohol consumption,  and sexually transmitted infections.    GENERAL RECOMMENDATIONS FOR GOOD HEALTH:  Healthy diet:  Eat a variety of foods, including fruit, vegetables, animal or vegetable protein, such as meat, fish, chicken, and eggs, or beans, lentils, tofu, and grains, such as rice.  Drink plenty of water daily.  Decrease saturated fat in the diet, avoid lots of red meat, processed foods, sweets, fast foods, and fried foods.  Exercise:  Aerobic exercise helps maintain good heart health. At least 30-40 minutes of moderate-intensity exercise is recommended. For example, a brisk walk that increases your heart rate and breathing. This should be done on most days of the week.   Find a type of exercise or a variety of exercises that you enjoy so that it becomes a part of your daily life.  Examples are running, walking, swimming, water aerobics, and biking.  For motivation and support, explore group exercise such as aerobic class, spin class, Zumba, Yoga,or  martial arts, etc.    Set exercise goals for yourself, such as a certain weight goal, walk or run in a race such as a 5k walk/run.  Speak to your primary care provider about exercise goals.  Disease prevention:  If you smoke or chew tobacco, find out from your caregiver how to quit. It can literally save your life, no matter how long you have been a tobacco user. If you do not use tobacco, never begin.   Maintain a healthy diet and normal weight. Increased weight leads to problems with blood pressure and diabetes.  The Body Mass Index or BMI is a way of measuring how much of your body is fat. Having a BMI above 27 increases the risk of heart disease, diabetes, hypertension, stroke and other problems related to obesity. Your caregiver can help determine your BMI and based on it develop an exercise and dietary program to help you achieve or maintain this important measurement at a healthful level.  High blood pressure causes heart and blood vessel problems.   Persistent high blood pressure should be treated with medicine if weight loss and exercise do not work.   Fat and cholesterol leaves deposits in your arteries that can block them. This causes heart disease and vessel disease elsewhere in your body.  If your cholesterol is found to be high, or if you have heart disease or certain other medical conditions, then you may need to have your cholesterol monitored frequently and be treated with medication.   Ask if you should have a cardiac stress test if your history suggests this. A stress test is a test done on a treadmill that looks for heart disease. This test can find disease prior to there being a problem.  Menopause can be associated with physical symptoms and risks. Hormone replacement therapy is available to decrease these. You should talk to your caregiver about whether starting or continuing to take hormones is right for you.   Osteoporosis is a disease in which the bones lose minerals and strength as we age. This can result in serious bone fractures. Risk of osteoporosis can be identified using a bone density scan. Women ages 55 and over should discuss this with their caregivers, as should women after menopause who have other risk factors. Ask your caregiver whether you should be taking a calcium supplement and Vitamin D, to reduce the rate of osteoporosis.   Avoid drinking alcohol in excess (more than two drinks per day).  Avoid use of street drugs. Do not share needles with anyone. Ask for professional help if you need assistance or instructions on stopping the use of alcohol, cigarettes, and/or drugs.  Brush your teeth twice a day with fluoride toothpaste, and floss once a day. Good oral hygiene prevents tooth decay and gum disease. The problems can be painful, unattractive, and can cause other health problems. Visit your dentist for a routine oral and dental check up and preventive care every 6-12 months.   Look at your skin regularly.  Use a  mirror to look at your back. Notify your caregivers of changes in moles, especially if there are changes in shapes, colors, a size larger than a pencil eraser, an irregular border, or development of new moles.  Safety:  Use seatbelts 100% of the time, whether driving or as a passenger.  Use safety devices such as hearing protection if you work in environments with loud noise or significant background noise.  Use safety glasses when doing any work that could send debris in to the eyes.  Use a helmet if you ride a bike or motorcycle.  Use appropriate safety gear for contact sports.  Talk to your caregiver about gun safety.  Use sunscreen with a SPF (or skin protection factor) of 15 or greater.  Lighter skinned people are at a greater risk of skin cancer. Don't forget to also wear sunglasses in order to protect your eyes from too much damaging sunlight. Damaging sunlight can accelerate cataract formation.   Practice safe sex. Use condoms. Condoms are used for birth control and to help reduce the  spread of sexually transmitted infections (or STIs).  Some of the STIs are gonorrhea (the clap), chlamydia, syphilis, trichomonas, herpes, HPV (human papilloma virus) and HIV (human immunodeficiency virus) which causes AIDS. The herpes, HIV and HPV are viral illnesses that have no cure. These can result in disability, cancer and death.   Keep carbon monoxide and smoke detectors in your home functioning at all times. Change the batteries every 6 months or use a model that plugs into the wall.   Vaccinations:  Stay up to date with your tetanus shots and other required immunizations. You should have a booster for tetanus every 10 years. Be sure to get your flu shot every year, since 5%-20% of the [place] population comes down with the flu. The flu vaccine changes each year, so being vaccinated once is not enough. Get your shot in the fall, before the flu season peaks.   Other vaccines to consider:  Human Papilloma  Virus or HPV causes cancer of the cervix, and other infections that can be transmitted from person to person. There is a vaccine for HPV, and females should get immunized between the ages of 57 and 21. It requires a series of 3 shots.   Pneumococcal vaccine to protect against certain types of pneumonia.  This is normally recommended for adults age 58 or older.  However, adults younger than 39 years old with certain underlying conditions such as diabetes, heart or lung disease should also receive the vaccine.  Shingles vaccine to protect against Varicella Zoster if you are older than age 43, or younger than 39 years old with certain underlying illness.  Hepatitis A vaccine to protect against a form of infection of the liver by a virus acquired from food.  Hepatitis B vaccine to protect against a form of infection of the liver by a virus acquired from blood or body fluids, particularly if you work in health care.  If you plan to travel internationally, check with your local health department for specific vaccination recommendations.  Cancer Screening:  Breast cancer screening is essential to preventive care for women. All women age 19 and older should perform a breast self-exam every month. At age 35 and older, women should have their caregiver complete a breast exam each year. Women at ages 73 and older should have a mammogram (x-ray film) of the breasts. Your caregiver can discuss how often you need mammograms.    Cervical cancer screening includes taking a Pap smear (sample of cells examined under a microscope) from the cervix (end of the uterus). It also includes testing for HPV (Human Papilloma Virus, which can cause cervical cancer). Screening and a pelvic exam should begin at age 38, or 3 years after a woman becomes sexually active. Screening should occur every year, with a Pap smear but no HPV testing, up to age 7. After age 67, you should have a Pap smear every 3 years with HPV testing, if no  HPV was found previously.   Most routine colon cancer screening begins at the age of 29. On a yearly basis, doctors may provide special easy to use take-home tests to check for hidden blood in the stool. Sigmoidoscopy or colonoscopy can detect the earliest forms of colon cancer and is life saving. These tests use a small camera at the end of a tube to directly examine the colon. Speak to your caregiver about this at age 67, when routine screening begins (and is repeated every 5 years unless early forms of pre-cancerous polyps or  small growths are found).

## 2016-11-07 LAB — LIPID PANEL
CHOLESTEROL: 174 mg/dL (ref <200)
HDL: 54 mg/dL (ref >50)
LDL Cholesterol (Calc): 99 mg/dL (calc)
Non-HDL Cholesterol (Calc): 120 mg/dL (calc) (ref <130)
Total CHOL/HDL Ratio: 3.2 (calc) (ref <5.0)
Triglycerides: 110 mg/dL (ref <150)

## 2016-11-07 LAB — COMPREHENSIVE METABOLIC PANEL
AG Ratio: 2.1 (calc) (ref 1.0–2.5)
ALKALINE PHOSPHATASE (APISO): 44 U/L (ref 33–115)
ALT: 16 U/L (ref 6–29)
AST: 16 U/L (ref 10–30)
Albumin: 4.7 g/dL (ref 3.6–5.1)
BILIRUBIN TOTAL: 0.7 mg/dL (ref 0.2–1.2)
BUN: 11 mg/dL (ref 7–25)
CALCIUM: 9.4 mg/dL (ref 8.6–10.2)
CHLORIDE: 104 mmol/L (ref 98–110)
CO2: 27 mmol/L (ref 20–32)
CREATININE: 0.74 mg/dL (ref 0.50–1.10)
GLOBULIN: 2.2 g/dL (ref 1.9–3.7)
Glucose, Bld: 89 mg/dL (ref 65–99)
POTASSIUM: 4.1 mmol/L (ref 3.5–5.3)
Sodium: 139 mmol/L (ref 135–146)
Total Protein: 6.9 g/dL (ref 6.1–8.1)

## 2016-11-07 LAB — CBC WITH DIFFERENTIAL/PLATELET
BASOS PCT: 1.1 %
Basophils Absolute: 50 cells/uL (ref 0–200)
EOS ABS: 149 {cells}/uL (ref 15–500)
Eosinophils Relative: 3.3 %
HEMATOCRIT: 38.7 % (ref 35.0–45.0)
Hemoglobin: 13.3 g/dL (ref 11.7–15.5)
LYMPHS ABS: 1908 {cells}/uL (ref 850–3900)
MCH: 31.7 pg (ref 27.0–33.0)
MCHC: 34.4 g/dL (ref 32.0–36.0)
MCV: 92.1 fL (ref 80.0–100.0)
MPV: 10.7 fL (ref 7.5–12.5)
Monocytes Relative: 7.1 %
NEUTROS PCT: 46.1 %
Neutro Abs: 2075 cells/uL (ref 1500–7800)
PLATELETS: 225 10*3/uL (ref 140–400)
RBC: 4.2 10*6/uL (ref 3.80–5.10)
RDW: 12.1 % (ref 11.0–15.0)
TOTAL LYMPHOCYTE: 42.4 %
WBC: 4.5 10*3/uL (ref 3.8–10.8)
WBCMIX: 320 {cells}/uL (ref 200–950)

## 2016-11-07 LAB — TSH: TSH: 1.25 m[IU]/L

## 2016-11-07 LAB — HEMOGLOBIN A1C
EAG (MMOL/L): 5 (calc)
Hgb A1c MFr Bld: 4.8 % of total Hgb (ref <5.7)
Mean Plasma Glucose: 91 (calc)

## 2016-11-08 ENCOUNTER — Encounter: Payer: Self-pay | Admitting: Family Medicine

## 2017-01-26 DIAGNOSIS — R69 Illness, unspecified: Secondary | ICD-10-CM | POA: Diagnosis not present

## 2017-01-26 DIAGNOSIS — Z01419 Encounter for gynecological examination (general) (routine) without abnormal findings: Secondary | ICD-10-CM | POA: Diagnosis not present

## 2017-01-26 DIAGNOSIS — Z6825 Body mass index (BMI) 25.0-25.9, adult: Secondary | ICD-10-CM | POA: Diagnosis not present

## 2017-02-05 DIAGNOSIS — Z1231 Encounter for screening mammogram for malignant neoplasm of breast: Secondary | ICD-10-CM | POA: Diagnosis not present

## 2017-09-05 ENCOUNTER — Encounter: Payer: Self-pay | Admitting: Family Medicine

## 2017-09-11 ENCOUNTER — Ambulatory Visit
Admission: RE | Admit: 2017-09-11 | Discharge: 2017-09-11 | Disposition: A | Discharge status: Home / Self Care | Payer: 59 | Source: Ambulatory Visit | Attending: Family Medicine | Admitting: Family Medicine

## 2017-09-11 ENCOUNTER — Ambulatory Visit (INDEPENDENT_AMBULATORY_CARE_PROVIDER_SITE_OTHER): Payer: 59 | Admitting: Family Medicine

## 2017-09-11 ENCOUNTER — Encounter: Payer: Self-pay | Admitting: Family Medicine

## 2017-09-11 VITALS — BP 130/70 | HR 75 | Temp 98.3°F | Resp 16 | Wt 147.6 lb

## 2017-09-11 DIAGNOSIS — R05 Cough: Secondary | ICD-10-CM | POA: Diagnosis not present

## 2017-09-11 DIAGNOSIS — R079 Chest pain, unspecified: Secondary | ICD-10-CM | POA: Diagnosis not present

## 2017-09-11 DIAGNOSIS — R058 Other specified cough: Secondary | ICD-10-CM

## 2017-09-11 DIAGNOSIS — Z566 Other physical and mental strain related to work: Secondary | ICD-10-CM | POA: Diagnosis not present

## 2017-09-11 DIAGNOSIS — N6452 Nipple discharge: Secondary | ICD-10-CM | POA: Diagnosis not present

## 2017-09-11 NOTE — Progress Notes (Signed)
Subjective:    Patient ID: Sharon Beck, female    DOB: 02-Sep-1975, 42 y.o.   MRN: [id number]  HPI Chief Complaint  Patient presents with  . chest discomfort    thinks its stress related since taking a new job. been going on 3-4 weeks. happens when at work but not on weekends   She is here with complaints of a 1 month history of intermittent mid sternal discomfort that feels like pressure. Pain is non radiating. She only has the pain at work. Never at home or on the weekends.  Pain is most weekdays. Lasts between a couple of minutes to almost her entire work day. Denies diaphoresis, nausea, vomiting, DOE. Questionable history of palpitations but unrelated to chest pressure per patient.  She did try Tums without any relief.  States she walked on the treadmill this morning for 2 miles and did not have any chest pain, palpitations or DOE No new medications, increase in caffeine or alcohol.   Denies personal or family history of cardiac disease.   Reports having a new job with increased stress.   She has also noticed an intermittent dry cough and she feels like she needs to but cannot cough anything up.  Former smoker. Intermittent for 10 years. 2 -10 cigarettes per day.  States her biggest fear is that she could have lung cancer.  Denies unexplained weight loss, back pain, fever, chills, night sweats.   Denies dizziness, abdominal pain, N/V/D, urinary symptoms, LE edema.   Reports having 1-2 glasses of wine per week. Eats more "junk food" lately and no regular exercise.   Complains of daily nipple discharge, non bloody.  She was having discharge from her left breast and this was occurring when she had her mammogram. She saw her OB/GYN for [facility]. She breast fed 2 years. Last saw OB/GYN Dr. Pamala Hurry in January.   LMP: 08/25/2017 No birth control but her husband had a vasectomy.   Reviewed allergies, medications, past medical, surgical, family, and social  history.   Review of Systems Pertinent positives and negatives in the history of present illness.     Objective:   Physical Exam BP 130/70   Pulse 75   Temp 98.3 F (36.8 C) (Oral)   Resp 16   Wt 147 lb 9.6 oz (67 kg)   LMP 08/25/2017 (Exact Date)   SpO2 99%   BMI 24.56 kg/m   Alert and oriented in no distress.  Pharyngeal area is normal. Neck is supple without adenopathy or thyromegaly. Cardiac exam shows a regular sinus rhythm without murmurs or gallops. Lungs are clear to auscultation. No chest wall tenderness. LE without edema, intact distal pulses. Skin is warm and dry, no pallor.       Assessment & Plan:  Intermittent chest pain - Plan: CBC with Differential/Platelet, Comprehensive metabolic panel, TSH, T4, free, EKG 12-Lead, DG Chest 2 View  Cough present for greater than 3 weeks - Plan: CBC with Differential/Platelet, Comprehensive metabolic panel  Discharge from breast - Plan: TSH, T4, free  Stress at work  Discussed possible etiologies for symptoms. ECG unremarkable. Read by Dr. Redmond School and myself.  CP present only at work speaks to this not being cardiac related. Consider stress and anxiety.  Will check labs and get a chest XR.  Discussed how to de-stress at work by taking deep breaths, relaxation techniques. Discussed counseling or CBT.  Follow up pending XR and labs. Consider referral to cardiology if she would like. She declines today.  Prefers to keep an eye on symptoms and I think this is appropriate for now.  She will see her OB/GYN for ongoing nipple discharge- non bloody. Discussed the importance of follow up for this.

## 2017-09-12 ENCOUNTER — Encounter: Payer: Self-pay | Admitting: Family Medicine

## 2017-09-12 LAB — CBC WITH DIFFERENTIAL/PLATELET
BASOS: 1 %
Basophils Absolute: 0 10*3/uL (ref 0.0–0.2)
EOS (ABSOLUTE): 0.2 10*3/uL (ref 0.0–0.4)
EOS: 4 %
HEMATOCRIT: 43.5 % (ref 34.0–46.6)
HEMOGLOBIN: 15.2 g/dL (ref 11.1–15.9)
Immature Grans (Abs): 0 10*3/uL (ref 0.0–0.1)
Immature Granulocytes: 0 %
LYMPHS ABS: 2 10*3/uL (ref 0.7–3.1)
Lymphs: 43 %
MCH: 32.1 pg (ref 26.6–33.0)
MCHC: 34.9 g/dL (ref 31.5–35.7)
MCV: 92 fL (ref 79–97)
MONOCYTES: 5 %
MONOS ABS: 0.2 10*3/uL (ref 0.1–0.9)
NEUTROS ABS: 2.2 10*3/uL (ref 1.4–7.0)
Neutrophils: 47 %
Platelets: 258 10*3/uL (ref 150–450)
RBC: 4.74 x10E6/uL (ref 3.77–5.28)
RDW: 13.4 % (ref 12.3–15.4)
WBC: 4.7 10*3/uL (ref 3.4–10.8)

## 2017-09-12 LAB — COMPREHENSIVE METABOLIC PANEL
A/G RATIO: 2 (ref 1.2–2.2)
ALK PHOS: 55 IU/L (ref 39–117)
ALT: 12 IU/L (ref 0–32)
AST: 15 IU/L (ref 0–40)
Albumin: 5.1 g/dL (ref 3.5–5.5)
BUN/Creatinine Ratio: 13 (ref 9–23)
BUN: 10 mg/dL (ref 6–24)
Bilirubin Total: 0.6 mg/dL (ref 0.0–1.2)
CO2: 21 mmol/L (ref 20–29)
CREATININE: 0.79 mg/dL (ref 0.57–1.00)
Calcium: 9.8 mg/dL (ref 8.7–10.2)
Chloride: 103 mmol/L (ref 96–106)
GFR calc Af Amer: 108 mL/min/{1.73_m2} (ref >59)
GFR calc non Af Amer: 94 mL/min/{1.73_m2} (ref >59)
GLOBULIN, TOTAL: 2.6 g/dL (ref 1.5–4.5)
Glucose: 96 mg/dL (ref 65–99)
Potassium: 4.3 mmol/L (ref 3.5–5.2)
SODIUM: 141 mmol/L (ref 134–144)
Total Protein: 7.7 g/dL (ref 6.0–8.5)

## 2017-09-12 LAB — TSH: TSH: 1.65 u[IU]/mL (ref 0.450–4.500)

## 2017-09-12 LAB — T4, FREE: Free T4: 1.2 ng/dL (ref 0.82–1.77)

## 2017-10-04 DIAGNOSIS — Z23 Encounter for immunization: Secondary | ICD-10-CM | POA: Diagnosis not present

## 2017-10-09 DIAGNOSIS — N6452 Nipple discharge: Secondary | ICD-10-CM | POA: Diagnosis not present

## 2017-10-12 ENCOUNTER — Other Ambulatory Visit: Payer: Self-pay | Admitting: Obstetrics

## 2017-10-12 DIAGNOSIS — N6452 Nipple discharge: Secondary | ICD-10-CM

## 2017-10-17 ENCOUNTER — Other Ambulatory Visit: Payer: Self-pay | Admitting: Obstetrics

## 2017-10-17 ENCOUNTER — Ambulatory Visit
Admission: RE | Admit: 2017-10-17 | Discharge: 2017-10-17 | Disposition: A | Discharge status: Home / Self Care | Payer: 59 | Source: Ambulatory Visit | Attending: Obstetrics | Admitting: Obstetrics

## 2017-10-17 DIAGNOSIS — N6321 Unspecified lump in the left breast, upper outer quadrant: Secondary | ICD-10-CM | POA: Diagnosis not present

## 2017-10-17 DIAGNOSIS — R922 Inconclusive mammogram: Secondary | ICD-10-CM | POA: Diagnosis not present

## 2017-10-17 DIAGNOSIS — N6452 Nipple discharge: Secondary | ICD-10-CM

## 2017-10-17 DIAGNOSIS — N6322 Unspecified lump in the left breast, upper inner quadrant: Secondary | ICD-10-CM | POA: Diagnosis not present

## 2017-10-19 ENCOUNTER — Ambulatory Visit
Admission: RE | Admit: 2017-10-19 | Discharge: 2017-10-19 | Disposition: A | Discharge status: Home / Self Care | Payer: 59 | Source: Ambulatory Visit | Attending: Obstetrics | Admitting: Obstetrics

## 2017-10-19 ENCOUNTER — Other Ambulatory Visit: Payer: Self-pay | Admitting: Obstetrics

## 2017-10-19 DIAGNOSIS — N6042 Mammary duct ectasia of left breast: Secondary | ICD-10-CM | POA: Diagnosis not present

## 2017-10-19 DIAGNOSIS — N6342 Unspecified lump in left breast, subareolar: Secondary | ICD-10-CM | POA: Diagnosis not present

## 2017-10-19 DIAGNOSIS — N6452 Nipple discharge: Secondary | ICD-10-CM

## 2017-10-19 DIAGNOSIS — N6321 Unspecified lump in the left breast, upper outer quadrant: Secondary | ICD-10-CM | POA: Diagnosis not present

## 2017-11-12 ENCOUNTER — Other Ambulatory Visit: Payer: Self-pay | Admitting: General Surgery

## 2017-11-12 DIAGNOSIS — N6452 Nipple discharge: Secondary | ICD-10-CM

## 2017-11-13 NOTE — Progress Notes (Signed)
Subjective:    Patient ID: Sharon Beck, female    DOB: 05-03-1975, 42 y.o.   MRN: [id number]  HPI Chief Complaint  Patient presents with  . cpe    fasting cpe, no other concerns, gets eye checked yearly   She is here for a complete physical exam. No concerns today   States she had a biopsy on her breast due to nipple drainage. Is having surgery for this issue next month.  Doing well otherwise.    Other providers: Dr. Donne Hazel- [facility]  Willa Rough at [facility] history: Lives with husband and 2 children, works at [employer]. Husband is a Software engineer.  Denies smoking, drinking alcohol, drug use  Diet: fairly healthy  Excerise: nothing and feels like she does not have time.   Immunizations: flu shot at [facility] in September   Health maintenance:  Mammogram: in the past few months Colonoscopy: never  Last Gynecological Exam: with OB/GYN  Last Menstrual cycle: 10/17/2017 Last Dental Exam: every 6 months  Last Eye Exam: due in November   Wears seatbelt always, uses sunscreen, smoke detectors in home and functioning, does not text while driving and feels safe in home environment.   Reviewed allergies, medications, past medical, surgical, family, and social history.   Review of Systems Review of Systems Constitutional: -fever, -chills, -sweats, -unexpected weight change,-fatigue ENT: -runny nose, -ear pain, -sore throat Cardiology:  -chest pain, -palpitations, -edema Respiratory: -cough, -shortness of breath, -wheezing Gastroenterology: -abdominal pain, -nausea, -vomiting, -diarrhea, -constipation  Hematology: -bleeding or bruising problems Musculoskeletal: -arthralgias, -myalgias, -joint swelling, -back pain Ophthalmology: -vision changes Urology: -dysuria, -difficulty urinating, -hematuria, -urinary frequency, -urgency Neurology: -headache, -weakness, -tingling, -numbness       Objective:   Physical Exam BP 120/70    Pulse 72   Ht 5\' 5"  (1.651 m)   Wt 151 lb 12.8 oz (68.9 kg)   LMP 10/17/2017   BMI 25.26 kg/m   General Appearance:    Alert, cooperative, no distress, appears stated age  Head:    Normocephalic, without obvious abnormality, atraumatic  Eyes:    PERRL, conjunctiva/corneas clear, EOM's intact, fundi    benign  Ears:    Normal TM's and external ear canals  Nose:   Nares normal, mucosa normal, no drainage or sinus   tenderness  Throat:   Lips, mucosa, and tongue normal; teeth and gums normal  Neck:   Supple, no lymphadenopathy;  thyroid:  no   enlargement/tenderness/nodules; no carotid   bruit or JVD  Back:    Spine nontender, no curvature, ROM normal, no CVA     tenderness  Lungs:     Clear to auscultation bilaterally without wheezes, rales or     ronchi; respirations unlabored  Chest Wall:    No tenderness or deformity   Heart:    Regular rate and rhythm, S1 and S2 normal, no murmur, rub   or gallop  Breast Exam:    OB/GYN  Abdomen:     Soft, non-tender, nondistended, normoactive bowel sounds,    no masses, no hepatosplenomegaly  Genitalia:    OB/GYN   Rectal:    Not performed due to age<40 and no related complaints  Extremities:   No clubbing, cyanosis or edema  Pulses:   2+ and symmetric all extremities  Skin:   Skin color, texture, turgor normal, no rashes or lesions  Lymph nodes:   Cervical, supraclavicular, and axillary nodes normal  Neurologic:   CNII-XII intact, normal strength,  sensation and gait; reflexes 2+ and symmetric throughout          Psych:   Normal mood, affect, hygiene and grooming.    Urinalysis dipstick: negative      Assessment & Plan:  Routine general medical examination at a health care facility - Plan: POCT Urinalysis DIP (Proadvantage Device)  Family history of colon cancer in father - Plan: POCT occult blood stool  Reviewed lab results and declines blood work today. This is fine.  She is doing quite well and in good spirits.  Concerns that she does  not have adequate time in her schedule to exercise. We discussed short workouts after her kids go to bed or getting up a few minutes early.  Counseling on healthy lifestyle.  Father diagnosed at age 65 with colon cancer but doing fine now. Screen today with 3 stool card testing. No changes in bowel habits or blood in stool.  Discussed referral to GI at age 59 or sooner if she is concerned.  UTD on mammogram and pap smears- at [place] Follow up in 1 year or sooner if needed.

## 2017-11-14 ENCOUNTER — Ambulatory Visit (INDEPENDENT_AMBULATORY_CARE_PROVIDER_SITE_OTHER): Payer: 59 | Admitting: Family Medicine

## 2017-11-14 ENCOUNTER — Other Ambulatory Visit: Payer: Self-pay | Admitting: General Surgery

## 2017-11-14 ENCOUNTER — Encounter: Payer: Self-pay | Admitting: Family Medicine

## 2017-11-14 VITALS — BP 120/70 | HR 72 | Ht 65.0 in | Wt 151.8 lb

## 2017-11-14 DIAGNOSIS — Z800 Family history of malignant neoplasm of digestive organs: Secondary | ICD-10-CM | POA: Diagnosis not present

## 2017-11-14 DIAGNOSIS — N6452 Nipple discharge: Secondary | ICD-10-CM

## 2017-11-14 DIAGNOSIS — Z0000 Encounter for general adult medical examination without abnormal findings: Secondary | ICD-10-CM | POA: Diagnosis not present

## 2017-11-14 HISTORY — DX: Family history of malignant neoplasm of digestive organs: Z80.0

## 2017-11-14 LAB — POCT URINALYSIS DIP (PROADVANTAGE DEVICE)
BILIRUBIN UA: NEGATIVE
Glucose, UA: NEGATIVE mg/dL
Ketones, POC UA: NEGATIVE mg/dL
Leukocytes, UA: NEGATIVE
NITRITE UA: NEGATIVE
PH UA: 7 (ref 5.0–8.0)
PROTEIN UA: NEGATIVE mg/dL
RBC UA: NEGATIVE
Specific Gravity, Urine: 1.015
UUROB: NEGATIVE

## 2017-11-14 NOTE — Patient Instructions (Addendum)
Check on the [employer]  Try to find at least 150 minutes per week to exercise.   Return the stool cards at your convenience.   I will see you back as needed or in one year for your annual exam.   Preventative Care for Adults - Female      MAINTAIN REGULAR HEALTH EXAMS:  A routine yearly physical is a good way to check in with your primary care provider about your health and preventive screening. It is also an opportunity to share updates about your health and any concerns you have, and receive a thorough all-over exam.   Most health insurance companies pay for at least some preventative services.  Check with your health plan for specific coverages.  WHAT PREVENTATIVE SERVICES DO WOMEN NEED?  Adult women should have their weight and blood pressure checked regularly.   Women age 30 and older should have their cholesterol levels checked regularly.  Women should be screened for cervical cancer with a Pap smear and pelvic exam beginning at age 70.  Breast cancer screening generally begins at age 25 with a mammogram and breast exam by your primary care provider.    Beginning at age 66 and continuing to age 32, women should be screened for colorectal cancer.  Certain people may need continued testing until age 84.  Updating vaccinations is part of preventative care.  Vaccinations help protect against diseases such as the flu.  Osteoporosis is a disease in which the bones lose minerals and strength as we age. Women ages 93 and over should discuss this with their caregivers, as should women after menopause who have other risk factors.  Lab tests are generally done as part of preventative care to screen for anemia and blood disorders, to screen for problems with the kidneys and liver, to screen for bladder problems, to check blood sugar, and to check your cholesterol level.  Preventative services generally include counseling about diet, exercise, avoiding tobacco, drugs, excessive alcohol  consumption, and sexually transmitted infections.    GENERAL RECOMMENDATIONS FOR GOOD HEALTH:  Healthy diet:  Eat a variety of foods, including fruit, vegetables, animal or vegetable protein, such as meat, fish, chicken, and eggs, or beans, lentils, tofu, and grains, such as rice.  Drink plenty of water daily.  Decrease saturated fat in the diet, avoid lots of red meat, processed foods, sweets, fast foods, and fried foods.  Exercise:  Aerobic exercise helps maintain good heart health. At least 30-40 minutes of moderate-intensity exercise is recommended. For example, a brisk walk that increases your heart rate and breathing. This should be done on most days of the week.   Find a type of exercise or a variety of exercises that you enjoy so that it becomes a part of your daily life.  Examples are running, walking, swimming, water aerobics, and biking.  For motivation and support, explore group exercise such as aerobic class, spin class, Zumba, Yoga,or  martial arts, etc.    Set exercise goals for yourself, such as a certain weight goal, walk or run in a race such as a 5k walk/run.  Speak to your primary care provider about exercise goals.  Disease prevention:  If you smoke or chew tobacco, find out from your caregiver how to quit. It can literally save your life, no matter how long you have been a tobacco user. If you do not use tobacco, never begin.   Maintain a healthy diet and normal weight. Increased weight leads to problems with blood pressure  and diabetes.   The Body Mass Index or BMI is a way of measuring how much of your body is fat. Having a BMI above 27 increases the risk of heart disease, diabetes, hypertension, stroke and other problems related to obesity. Your caregiver can help determine your BMI and based on it develop an exercise and dietary program to help you achieve or maintain this important measurement at a healthful level.  High blood pressure causes heart and blood  vessel problems.  Persistent high blood pressure should be treated with medicine if weight loss and exercise do not work.   Fat and cholesterol leaves deposits in your arteries that can block them. This causes heart disease and vessel disease elsewhere in your body.  If your cholesterol is found to be high, or if you have heart disease or certain other medical conditions, then you may need to have your cholesterol monitored frequently and be treated with medication.   Ask if you should have a cardiac stress test if your history suggests this. A stress test is a test done on a treadmill that looks for heart disease. This test can find disease prior to there being a problem.  Menopause can be associated with physical symptoms and risks. Hormone replacement therapy is available to decrease these. You should talk to your caregiver about whether starting or continuing to take hormones is right for you.   Osteoporosis is a disease in which the bones lose minerals and strength as we age. This can result in serious bone fractures. Risk of osteoporosis can be identified using a bone density scan. Women ages 44 and over should discuss this with their caregivers, as should women after menopause who have other risk factors. Ask your caregiver whether you should be taking a calcium supplement and Vitamin D, to reduce the rate of osteoporosis.   Avoid drinking alcohol in excess (more than two drinks per day).  Avoid use of street drugs. Do not share needles with anyone. Ask for professional help if you need assistance or instructions on stopping the use of alcohol, cigarettes, and/or drugs.  Brush your teeth twice a day with fluoride toothpaste, and floss once a day. Good oral hygiene prevents tooth decay and gum disease. The problems can be painful, unattractive, and can cause other health problems. Visit your dentist for a routine oral and dental check up and preventive care every 6-12 months.   Look at your skin  regularly.  Use a mirror to look at your back. Notify your caregivers of changes in moles, especially if there are changes in shapes, colors, a size larger than a pencil eraser, an irregular border, or development of new moles.  Safety:  Use seatbelts 100% of the time, whether driving or as a passenger.  Use safety devices such as hearing protection if you work in environments with loud noise or significant background noise.  Use safety glasses when doing any work that could send debris in to the eyes.  Use a helmet if you ride a bike or motorcycle.  Use appropriate safety gear for contact sports.  Talk to your caregiver about gun safety.  Use sunscreen with a SPF (or skin protection factor) of 15 or greater.  Lighter skinned people are at a greater risk of skin cancer. Don't forget to also wear sunglasses in order to protect your eyes from too much damaging sunlight. Damaging sunlight can accelerate cataract formation.   Practice safe sex. Use condoms. Condoms are used for birth control and  to help reduce the spread of sexually transmitted infections (or STIs).  Some of the STIs are gonorrhea (the clap), chlamydia, syphilis, trichomonas, herpes, HPV (human papilloma virus) and HIV (human immunodeficiency virus) which causes AIDS. The herpes, HIV and HPV are viral illnesses that have no cure. These can result in disability, cancer and death.   Keep carbon monoxide and smoke detectors in your home functioning at all times. Change the batteries every 6 months or use a model that plugs into the wall.   Vaccinations:  Stay up to date with your tetanus shots and other required immunizations. You should have a booster for tetanus every 10 years. Be sure to get your flu shot every year, since 5%-20% of the [place] population comes down with the flu. The flu vaccine changes each year, so being vaccinated once is not enough. Get your shot in the fall, before the flu season peaks.   Other vaccines to  consider:  Human Papilloma Virus or HPV causes cancer of the cervix, and other infections that can be transmitted from person to person. There is a vaccine for HPV, and females should get immunized between the ages of 25 and 60. It requires a series of 3 shots.   Pneumococcal vaccine to protect against certain types of pneumonia.  This is normally recommended for adults age 57 or older.  However, adults younger than 40 years old with certain underlying conditions such as diabetes, heart or lung disease should also receive the vaccine.  Shingles vaccine to protect against Varicella Zoster if you are older than age 84, or younger than 40 years old with certain underlying illness.  Hepatitis A vaccine to protect against a form of infection of the liver by a virus acquired from food.  Hepatitis B vaccine to protect against a form of infection of the liver by a virus acquired from blood or body fluids, particularly if you work in health care.  If you plan to travel internationally, check with your local health department for specific vaccination recommendations.  Cancer Screening:  Breast cancer screening is essential to preventive care for women. All women age 28 and older should perform a breast self-exam every month. At age 67 and older, women should have their caregiver complete a breast exam each year. Women at ages 13 and older should have a mammogram (x-ray film) of the breasts. Your caregiver can discuss how often you need mammograms.    Cervical cancer screening includes taking a Pap smear (sample of cells examined under a microscope) from the cervix (end of the uterus). It also includes testing for HPV (Human Papilloma Virus, which can cause cervical cancer). Screening and a pelvic exam should begin at age 31, or 3 years after a woman becomes sexually active. Screening should occur every year, with a Pap smear but no HPV testing, up to age 100. After age 51, you should have a Pap smear every 3  years with HPV testing, if no HPV was found previously.   Most routine colon cancer screening begins at the age of 45. On a yearly basis, doctors may provide special easy to use take-home tests to check for hidden blood in the stool. Sigmoidoscopy or colonoscopy can detect the earliest forms of colon cancer and is life saving. These tests use a small camera at the end of a tube to directly examine the colon. Speak to your caregiver about this at age 82, when routine screening begins (and is repeated every 5 years unless early forms  of pre-cancerous polyps or small growths are found).

## 2017-12-03 ENCOUNTER — Other Ambulatory Visit: Payer: Self-pay

## 2017-12-03 ENCOUNTER — Encounter (HOSPITAL_BASED_OUTPATIENT_CLINIC_OR_DEPARTMENT_OTHER): Payer: Self-pay | Admitting: *Deleted

## 2017-12-05 ENCOUNTER — Ambulatory Visit
Admission: RE | Admit: 2017-12-05 | Discharge: 2017-12-05 | Disposition: A | Discharge status: Home / Self Care | Payer: 59 | Source: Ambulatory Visit | Attending: General Surgery | Admitting: General Surgery

## 2017-12-05 DIAGNOSIS — N6342 Unspecified lump in left breast, subareolar: Secondary | ICD-10-CM | POA: Diagnosis not present

## 2017-12-05 DIAGNOSIS — N6452 Nipple discharge: Secondary | ICD-10-CM

## 2017-12-05 NOTE — Progress Notes (Signed)
Ensure pre surgery drink given with instructions to complete by 0430 dos, surgical soap given with instructions, pt verbalized understanding. 

## 2017-12-06 ENCOUNTER — Ambulatory Visit (HOSPITAL_BASED_OUTPATIENT_CLINIC_OR_DEPARTMENT_OTHER): Payer: 59 | Admitting: Anesthesiology

## 2017-12-06 ENCOUNTER — Encounter (HOSPITAL_BASED_OUTPATIENT_CLINIC_OR_DEPARTMENT_OTHER): Payer: Self-pay | Admitting: Anesthesiology

## 2017-12-06 ENCOUNTER — Encounter (HOSPITAL_BASED_OUTPATIENT_CLINIC_OR_DEPARTMENT_OTHER)
Admission: RE | Disposition: A | Discharge status: Home / Self Care | Payer: Self-pay | Source: Ambulatory Visit | Attending: General Surgery

## 2017-12-06 ENCOUNTER — Ambulatory Visit
Admission: RE | Admit: 2017-12-06 | Discharge: 2017-12-06 | Disposition: A | Discharge status: Home / Self Care | Payer: 59 | Source: Ambulatory Visit | Attending: General Surgery | Admitting: General Surgery

## 2017-12-06 ENCOUNTER — Other Ambulatory Visit: Payer: Self-pay

## 2017-12-06 ENCOUNTER — Ambulatory Visit (HOSPITAL_BASED_OUTPATIENT_CLINIC_OR_DEPARTMENT_OTHER)
Admission: RE | Admit: 2017-12-06 | Discharge: 2017-12-06 | Disposition: A | Discharge status: Home / Self Care | Payer: 59 | Source: Ambulatory Visit | Attending: General Surgery | Admitting: General Surgery

## 2017-12-06 DIAGNOSIS — D242 Benign neoplasm of left breast: Secondary | ICD-10-CM | POA: Diagnosis not present

## 2017-12-06 DIAGNOSIS — Z87891 Personal history of nicotine dependence: Secondary | ICD-10-CM | POA: Diagnosis not present

## 2017-12-06 DIAGNOSIS — Z8371 Family history of colonic polyps: Secondary | ICD-10-CM | POA: Insufficient documentation

## 2017-12-06 DIAGNOSIS — Z886 Allergy status to analgesic agent status: Secondary | ICD-10-CM | POA: Diagnosis not present

## 2017-12-06 DIAGNOSIS — Z8249 Family history of ischemic heart disease and other diseases of the circulatory system: Secondary | ICD-10-CM | POA: Insufficient documentation

## 2017-12-06 DIAGNOSIS — N6452 Nipple discharge: Secondary | ICD-10-CM

## 2017-12-06 DIAGNOSIS — Z833 Family history of diabetes mellitus: Secondary | ICD-10-CM | POA: Insufficient documentation

## 2017-12-06 DIAGNOSIS — Z8349 Family history of other endocrine, nutritional and metabolic diseases: Secondary | ICD-10-CM | POA: Insufficient documentation

## 2017-12-06 DIAGNOSIS — R928 Other abnormal and inconclusive findings on diagnostic imaging of breast: Secondary | ICD-10-CM | POA: Diagnosis not present

## 2017-12-06 DIAGNOSIS — Z800 Family history of malignant neoplasm of digestive organs: Secondary | ICD-10-CM | POA: Diagnosis not present

## 2017-12-06 HISTORY — PX: RADIOACTIVE SEED GUIDED EXCISIONAL BREAST BIOPSY: SHX6490

## 2017-12-06 HISTORY — DX: Nipple discharge: N64.52

## 2017-12-06 HISTORY — DX: Motion sickness, initial encounter: T75.3XXA

## 2017-12-06 LAB — POCT PREGNANCY, URINE: Preg Test, Ur: NEGATIVE

## 2017-12-06 SURGERY — RADIOACTIVE SEED GUIDED BREAST BIOPSY
Anesthesia: General | Site: Breast | Laterality: Left

## 2017-12-06 MED ORDER — DEXAMETHASONE SODIUM PHOSPHATE 10 MG/ML IJ SOLN
INTRAMUSCULAR | Status: AC
  Filled 2017-12-06: qty 1

## 2017-12-06 MED ORDER — IBUPROFEN 400 MG PO TABS
400.0000 mg | ORAL_TABLET | Freq: Once | ORAL | Status: AC
  Administered 2017-12-06: 400 mg via ORAL

## 2017-12-06 MED ORDER — FENTANYL CITRATE (PF) 100 MCG/2ML IJ SOLN
50.0000 ug | INTRAMUSCULAR | Status: DC | PRN
  Administered 2017-12-06 (×2): 50 ug via INTRAVENOUS

## 2017-12-06 MED ORDER — LACTATED RINGERS IV SOLN
INTRAVENOUS | Status: DC
  Administered 2017-12-06: 07:00:00 via INTRAVENOUS

## 2017-12-06 MED ORDER — CEFAZOLIN SODIUM-DEXTROSE 2-4 GM/100ML-% IV SOLN
2.0000 g | INTRAVENOUS | Status: AC
  Administered 2017-12-06: 2 g via INTRAVENOUS

## 2017-12-06 MED ORDER — BUPIVACAINE HCL (PF) 0.25 % IJ SOLN
INTRAMUSCULAR | Status: DC | PRN
  Administered 2017-12-06: 10 mL

## 2017-12-06 MED ORDER — LIDOCAINE 2% (20 MG/ML) 5 ML SYRINGE
INTRAMUSCULAR | Status: AC
  Filled 2017-12-06: qty 5

## 2017-12-06 MED ORDER — IBUPROFEN 200 MG PO TABS
ORAL_TABLET | ORAL | Status: AC
  Filled 2017-12-06: qty 2

## 2017-12-06 MED ORDER — MIDAZOLAM HCL 2 MG/2ML IJ SOLN
INTRAMUSCULAR | Status: AC
  Filled 2017-12-06: qty 2

## 2017-12-06 MED ORDER — ACETAMINOPHEN 500 MG PO TABS
1000.0000 mg | ORAL_TABLET | ORAL | Status: AC
  Administered 2017-12-06: 1000 mg via ORAL

## 2017-12-06 MED ORDER — SCOPOLAMINE 1 MG/3DAYS TD PT72: 1.0000 | MEDICATED_PATCH | Freq: Once | TRANSDERMAL | Status: DC | PRN

## 2017-12-06 MED ORDER — ACETAMINOPHEN 500 MG PO TABS
ORAL_TABLET | ORAL | Status: AC
  Filled 2017-12-06: qty 2

## 2017-12-06 MED ORDER — FENTANYL CITRATE (PF) 100 MCG/2ML IJ SOLN
INTRAMUSCULAR | Status: AC
  Filled 2017-12-06: qty 2

## 2017-12-06 MED ORDER — MIDAZOLAM HCL 2 MG/2ML IJ SOLN
1.0000 mg | INTRAMUSCULAR | Status: DC | PRN
  Administered 2017-12-06: 2 mg via INTRAVENOUS

## 2017-12-06 MED ORDER — ONDANSETRON HCL 4 MG/2ML IJ SOLN
INTRAMUSCULAR | Status: AC
  Filled 2017-12-06: qty 2

## 2017-12-06 MED ORDER — HYDROMORPHONE HCL 1 MG/ML IJ SOLN: 0.2500 mg | INTRAMUSCULAR | Status: DC | PRN

## 2017-12-06 MED ORDER — LIDOCAINE HCL (CARDIAC) PF 100 MG/5ML IV SOSY
PREFILLED_SYRINGE | INTRAVENOUS | Status: DC | PRN
  Administered 2017-12-06: 100 mg via INTRAVENOUS

## 2017-12-06 MED ORDER — ONDANSETRON HCL 4 MG/2ML IJ SOLN
INTRAMUSCULAR | Status: DC | PRN
  Administered 2017-12-06: 4 mg via INTRAVENOUS

## 2017-12-06 MED ORDER — PROPOFOL 10 MG/ML IV BOLUS
INTRAVENOUS | Status: DC | PRN
  Administered 2017-12-06: 150 mg via INTRAVENOUS
  Administered 2017-12-06: 20 mg via INTRAVENOUS

## 2017-12-06 MED ORDER — PROPOFOL 500 MG/50ML IV EMUL
INTRAVENOUS | Status: AC
  Filled 2017-12-06: qty 50

## 2017-12-06 MED ORDER — MEPERIDINE HCL 25 MG/ML IJ SOLN: 6.2500 mg | INTRAMUSCULAR | Status: DC | PRN

## 2017-12-06 MED ORDER — ENSURE PRE-SURGERY PO LIQD: 296.0000 mL | Freq: Once | ORAL | Status: DC

## 2017-12-06 MED ORDER — GABAPENTIN 100 MG PO CAPS
ORAL_CAPSULE | ORAL | Status: AC
  Filled 2017-12-06: qty 1

## 2017-12-06 MED ORDER — DEXAMETHASONE SODIUM PHOSPHATE 4 MG/ML IJ SOLN
INTRAMUSCULAR | Status: DC | PRN
  Administered 2017-12-06: 10 mg via INTRAVENOUS

## 2017-12-06 MED ORDER — CEFAZOLIN SODIUM-DEXTROSE 2-4 GM/100ML-% IV SOLN
INTRAVENOUS | Status: AC
  Filled 2017-12-06: qty 100

## 2017-12-06 MED ORDER — ONDANSETRON HCL 4 MG/2ML IJ SOLN: 4.0000 mg | Freq: Once | INTRAMUSCULAR | Status: DC | PRN

## 2017-12-06 MED ORDER — BUPIVACAINE HCL (PF) 0.25 % IJ SOLN
INTRAMUSCULAR | Status: AC
  Filled 2017-12-06: qty 60

## 2017-12-06 MED ORDER — GABAPENTIN 100 MG PO CAPS
100.0000 mg | ORAL_CAPSULE | ORAL | Status: AC
  Administered 2017-12-06: 100 mg via ORAL

## 2017-12-06 SURGICAL SUPPLY — 60 items
APPLIER CLIP 9.375 MED OPEN (MISCELLANEOUS)
BINDER BREAST LRG (GAUZE/BANDAGES/DRESSINGS) ×2 IMPLANT
BINDER BREAST MEDIUM (GAUZE/BANDAGES/DRESSINGS) IMPLANT
BINDER BREAST XLRG (GAUZE/BANDAGES/DRESSINGS) IMPLANT
BINDER BREAST XXLRG (GAUZE/BANDAGES/DRESSINGS) IMPLANT
BLADE SURG 15 STRL LF DISP TIS (BLADE) ×1 IMPLANT
BLADE SURG 15 STRL SS (BLADE) ×1
CANISTER SUC SOCK COL 7IN (MISCELLANEOUS) IMPLANT
CANISTER SUCT 1200ML W/VALVE (MISCELLANEOUS) IMPLANT
CHLORAPREP W/TINT 26ML (MISCELLANEOUS) ×2 IMPLANT
CLIP APPLIE 9.375 MED OPEN (MISCELLANEOUS) IMPLANT
CLIP VESOCCLUDE SM WIDE 6/CT (CLIP) ×2 IMPLANT
COVER BACK TABLE 60X90IN (DRAPES) ×2 IMPLANT
COVER MAYO STAND STRL (DRAPES) ×2 IMPLANT
COVER PROBE W GEL 5X96 (DRAPES) ×2 IMPLANT
COVER WAND RF STERILE (DRAPES) IMPLANT
DECANTER SPIKE VIAL GLASS SM (MISCELLANEOUS) IMPLANT
DERMABOND ADVANCED (GAUZE/BANDAGES/DRESSINGS) ×1
DERMABOND ADVANCED .7 DNX12 (GAUZE/BANDAGES/DRESSINGS) ×1 IMPLANT
DEVICE DUBIN W/COMP PLATE 8390 (MISCELLANEOUS) ×2 IMPLANT
DRAPE LAPAROSCOPIC ABDOMINAL (DRAPES) ×2 IMPLANT
DRAPE UTILITY XL STRL (DRAPES) ×2 IMPLANT
DRSG TEGADERM 4X4.75 (GAUZE/BANDAGES/DRESSINGS) IMPLANT
ELECT COATED BLADE 2.86 ST (ELECTRODE) ×2 IMPLANT
ELECT REM PT RETURN 9FT ADLT (ELECTROSURGICAL) ×2
ELECTRODE REM PT RTRN 9FT ADLT (ELECTROSURGICAL) ×1 IMPLANT
GAUZE SPONGE 4X4 12PLY STRL LF (GAUZE/BANDAGES/DRESSINGS) IMPLANT
GLOVE BIO SURGEON STRL SZ7 (GLOVE) ×4 IMPLANT
GLOVE BIOGEL PI IND STRL 7.0 (GLOVE) ×1 IMPLANT
GLOVE BIOGEL PI IND STRL 7.5 (GLOVE) ×1 IMPLANT
GLOVE BIOGEL PI INDICATOR 7.0 (GLOVE) ×1
GLOVE BIOGEL PI INDICATOR 7.5 (GLOVE) ×1
GLOVE SURG SYN 8.0 (GLOVE) ×2 IMPLANT
GOWN STRL REUS W/ TWL LRG LVL3 (GOWN DISPOSABLE) ×2 IMPLANT
GOWN STRL REUS W/ TWL XL LVL3 (GOWN DISPOSABLE) ×1 IMPLANT
GOWN STRL REUS W/TWL LRG LVL3 (GOWN DISPOSABLE) ×2
GOWN STRL REUS W/TWL XL LVL3 (GOWN DISPOSABLE) ×1
HEMOSTAT ARISTA ABSORB 3G PWDR (MISCELLANEOUS) IMPLANT
ILLUMINATOR WAVEGUIDE N/F (MISCELLANEOUS) IMPLANT
KIT MARKER MARGIN INK (KITS) ×2 IMPLANT
LIGHT WAVEGUIDE WIDE FLAT (MISCELLANEOUS) IMPLANT
NEEDLE HYPO 25X1 1.5 SAFETY (NEEDLE) ×2 IMPLANT
NS IRRIG 1000ML POUR BTL (IV SOLUTION) IMPLANT
PACK BASIN DAY SURGERY FS (CUSTOM PROCEDURE TRAY) ×2 IMPLANT
PENCIL BUTTON HOLSTER BLD 10FT (ELECTRODE) ×2 IMPLANT
SLEEVE SCD COMPRESS KNEE MED (MISCELLANEOUS) ×2 IMPLANT
SPONGE LAP 4X18 RFD (DISPOSABLE) ×2 IMPLANT
STRIP CLOSURE SKIN 1/2X4 (GAUZE/BANDAGES/DRESSINGS) ×2 IMPLANT
SUT MNCRL AB 4-0 PS2 18 (SUTURE) IMPLANT
SUT MON AB 5-0 PS2 18 (SUTURE) ×2 IMPLANT
SUT SILK 2 0 SH (SUTURE) IMPLANT
SUT VIC AB 2-0 SH 27 (SUTURE) ×1
SUT VIC AB 2-0 SH 27XBRD (SUTURE) ×1 IMPLANT
SUT VIC AB 3-0 SH 27 (SUTURE) ×1
SUT VIC AB 3-0 SH 27X BRD (SUTURE) ×1 IMPLANT
SYR CONTROL 10ML LL (SYRINGE) ×2 IMPLANT
TOWEL GREEN STERILE FF (TOWEL DISPOSABLE) ×2 IMPLANT
TOWEL OR NON WOVEN STRL DISP B (DISPOSABLE) IMPLANT
TUBE CONNECTING 20X1/4 (TUBING) IMPLANT
YANKAUER SUCT BULB TIP NO VENT (SUCTIONS) IMPLANT

## 2017-12-06 NOTE — Anesthesia Preprocedure Evaluation (Signed)
Anesthesia Evaluation  Patient identified by MRN, date of birth, ID band Patient awake    Reviewed: Allergy & Precautions, NPO status , Patient's Chart, lab work & pertinent test results  Airway Mallampati: I  TM Distance: >3 FB Neck ROM: Full    Dental   Pulmonary former smoker,    Pulmonary exam normal        Cardiovascular Normal cardiovascular exam     Neuro/Psych    GI/Hepatic   Endo/Other    Renal/GU      Musculoskeletal   Abdominal   Peds  Hematology   Anesthesia Other Findings   Reproductive/Obstetrics                             Anesthesia Physical Anesthesia Plan  ASA: II  Anesthesia Plan: General   Post-op Pain Management:    Induction: Intravenous  PONV Risk Score and Plan: 3 and Ondansetron, Midazolam and Treatment may vary due to age or medical condition  Airway Management Planned: LMA  Additional Equipment:   Intra-op Plan:   Post-operative Plan: Extubation in OR  Informed Consent: I have reviewed the patients History and Physical, chart, labs and discussed the procedure including the risks, benefits and alternatives for the proposed anesthesia with the patient or authorized representative who has indicated his/her understanding and acceptance.     Plan Discussed with: CRNA and Surgeon  Anesthesia Plan Comments:         Anesthesia Quick Evaluation

## 2017-12-06 NOTE — Transfer of Care (Signed)
Immediate Anesthesia Transfer of Care Note  Patient: Sharon Beck  Procedure(s) Performed: RADIOACTIVE SEED GUIDED EXCISIONAL BREAST BIOPSY (Left Breast)  Patient Location: PACU  Anesthesia Type:General  Level of Consciousness: awake, sedated and patient cooperative  Airway & Oxygen Therapy: Patient Spontanous Breathing and Patient connected to face mask oxygen  Post-op Assessment: Report given to RN and Post -op Vital signs reviewed and stable  Post vital signs: Reviewed and stable  Last Vitals:  Vitals Value Taken Time  BP 144/95 12/06/2017  8:18 AM  Temp    Pulse 92 12/06/2017  8:19 AM  Resp 19 12/06/2017  8:19 AM  SpO2 100 % 12/06/2017  8:19 AM  Vitals shown include unvalidated device data.  Last Pain:  Vitals:   12/06/17 0651  TempSrc: Oral  PainSc: 0-No pain         Complications: No apparent anesthesia complications

## 2017-12-06 NOTE — Interval H&P Note (Signed)
History and Physical Interval Note:  12/06/2017 7:22 AM  Sharon Beck  has presented today for surgery, with the diagnosis of LEFT NIPPLE DISCHARGE  The various methods of treatment have been discussed with the patient and family. After consideration of risks, benefits and other options for treatment, the patient has consented to  Procedure(s): RADIOACTIVE SEED GUIDED EXCISIONAL BREAST BIOPSY (Left) as a surgical intervention .  The patient's history has been reviewed, patient examined, no change in status, stable for surgery.  I have reviewed the patient's chart and labs.  Questions were answered to the patient's satisfaction.     Rolm Bookbinder

## 2017-12-06 NOTE — Anesthesia Postprocedure Evaluation (Signed)
Anesthesia Post Note  Patient: Sharon Beck  Procedure(s) Performed: RADIOACTIVE SEED GUIDED EXCISIONAL BREAST BIOPSY (Left Breast)     Patient location during evaluation: PACU Anesthesia Type: General Level of consciousness: awake and alert Pain management: pain level controlled Vital Signs Assessment: post-procedure vital signs reviewed and stable Respiratory status: spontaneous breathing, nonlabored ventilation, respiratory function stable and patient connected to nasal cannula oxygen Cardiovascular status: blood pressure returned to baseline and stable Postop Assessment: no apparent nausea or vomiting Anesthetic complications: no    Last Vitals:  Vitals:   12/06/17 0945 12/06/17 1000  BP: (!) 150/89 (!) 146/82  Pulse: 72   Resp: 18   Temp: 36.8 C   SpO2: 100%     Last Pain:  Vitals:   12/06/17 0945  TempSrc:   PainSc: 3                  Jalesia Loudenslager DAVID

## 2017-12-06 NOTE — Discharge Instructions (Signed)
NO TYLENOL BEFORE 1 PM TODAY! NO IBUPROFEN BEFORE 3:30PM TODAY!        Post Anesthesia Home Care Instructions  Activity: Get plenty of rest for the remainder of the day. A responsible individual must stay with you for 24 hours following the procedure.  For the next 24 hours, DO NOT: -Drive a car -Paediatric nurse -Drink alcoholic beverages -Take any medication unless instructed by your physician -Make any legal decisions or sign important papers.  Meals: Start with liquid foods such as gelatin or soup. Progress to regular foods as tolerated. Avoid greasy, spicy, heavy foods. If nausea and/or vomiting occur, drink only clear liquids until the nausea and/or vomiting subsides. Call your physician if vomiting continues.  Special Instructions/Symptoms: Your throat may feel dry or sore from the anesthesia or the breathing tube placed in your throat during surgery. If this causes discomfort, gargle with warm salt water. The discomfort should disappear within 24 hours.  If you had a scopolamine patch placed behind your ear for the management of post- operative nausea and/or vomiting:  1. The medication in the patch is effective for 72 hours, after which it should be removed.  Wrap patch in a tissue and discard in the trash. Wash hands thoroughly with soap and water. 2. You may remove the patch earlier than 72 hours if you experience unpleasant side effects which may include dry mouth, dizziness or visual disturbances. 3. Avoid touching the patch. Wash your hands with soap and water after contact with the patch.       [facility] Phone Number [phone]   POST OP INSTRUCTIONS Take 400 mg of ibuprofen every 8 hours or 650 mg tylenol every 6 hours for next 72 hours then as needed. Use ice several times daily also. Always review your discharge instruction sheet given to you by the facility where your surgery was performed.  IF YOU HAVE DISABILITY OR FAMILY  LEAVE FORMS, YOU MUST BRING THEM TO THE OFFICE FOR PROCESSING.  DO NOT GIVE THEM TO YOUR DOCTOR.  1. A prescription for pain medication may be given to you upon discharge.  Take your pain medication as prescribed, if needed.  If narcotic pain medicine is not needed, then you may take acetaminophen (Tylenol), naprosyn (Alleve) or ibuprofen (Advil) as needed. 2. Take your usually prescribed medications unless otherwise directed 3. If you need a refill on your pain medication, please contact your pharmacy.  They will contact our office to request authorization.  Prescriptions will not be filled after 5pm or on week-ends. 4. You should eat very light the first 24 hours after surgery, such as soup, crackers, pudding, etc.  Resume your normal diet the day after surgery. 5. Most patients will experience some swelling and bruising in the breast.  Ice packs and a good support bra will help.  Wear the breast binder provided or a sports bra for 72 hours day and night.  After that wear a sports bra during the day until you return to the office. Swelling and bruising can take several days to resolve.  6. It is common to experience some constipation if taking pain medication after surgery.  Increasing fluid intake and taking a stool softener will usually help or prevent this problem from occurring.  A mild laxative (Milk of Magnesia or Miralax) should be taken according to package directions if there are no bowel movements after 48 hours. 7. Unless discharge instructions indicate otherwise, you may remove your bandages 48 hours after  surgery and you may shower at that time.  You may have steri-strips (small skin tapes) in place directly over the incision.  These strips should be left on the skin for 7-10 days and will come off on their own.  If your surgeon used skin glue on the incision, you may shower in 24 hours.  The glue will flake off over the next 2-3 weeks.  Any sutures or staples will be removed at the office  during your follow-up visit. 8. ACTIVITIES:  You may resume regular daily activities (gradually increasing) beginning the next day.  Wearing a good support bra or sports bra minimizes pain and swelling.  You may have sexual intercourse when it is comfortable. a. You may drive when you no longer are taking prescription pain medication, you can comfortably wear a seatbelt, and you can safely maneuver your car and apply brakes. b. RETURN TO WORK:  ______________________________________________________________________________________ 9. You should see your doctor in the office for a follow-up appointment approximately two weeks after your surgery.  Your doctors nurse will typically make your follow-up appointment when she calls you with your pathology report.  Expect your pathology report 3-4 business days after your surgery.  You may call to check if you do not hear from [place] after three days. 10. OTHER INSTRUCTIONS: _______________________________________________________________________________________________ _____________________________________________________________________________________________________________________________________ _____________________________________________________________________________________________________________________________________ _____________________________________________________________________________________________________________________________________  WHEN TO CALL DR WAKEFIELD: 1. Fever over 101.0 2. Nausea and/or vomiting. 3. Extreme swelling or bruising. 4. Continued bleeding from incision. 5. Increased pain, redness, or drainage from the incision.  The clinic staff is available to answer your questions during regular business hours.  Please dont hesitate to call and ask to speak to one of the nurses for clinical concerns.  If you have a medical emergency, go to the nearest emergency room or call 911.  A surgeon from [facility] is  always on call at the hospital.  For further questions, please visit [email or web address]

## 2017-12-06 NOTE — Anesthesia Procedure Notes (Signed)
Procedure Name: LMA Insertion Date/Time: 12/06/2017 7:34 AM Performed by: Lyndee Leo, CRNA Pre-anesthesia Checklist: Patient identified, Emergency Drugs available, Suction available and Patient being monitored Patient Re-evaluated:Patient Re-evaluated prior to induction Oxygen Delivery Method: Circle system utilized Preoxygenation: Pre-oxygenation with 100% oxygen Induction Type: IV induction Ventilation: Mask ventilation without difficulty LMA: LMA inserted LMA Size: 4.0 Number of attempts: 1 Airway Equipment and Method: Bite block Placement Confirmation: positive ETCO2 Tube secured with: Tape Dental Injury: Teeth and Oropharynx as per pre-operative assessment

## 2017-12-06 NOTE — H&P (Signed)
42 yof referred by Dr Autumn Patty who has had left nipple dc that has been clear/yellow for some time and was recently bloody. she has no family history of breast cancer and no real breast history herself. TC lifetime risk is average. she had mm that shows c density breasts. this is normal. she was noted to have on [place] multiple prominent ducts. the largest of these areas is at 12 oclock measuring 1.3x0.3x0.5 cm and 7 oclock measuring 13x4x6 mm in size. US of the left axilla is negative. biopsy of both of these lesions was performed and the path is benign. the lesion 1 cm retroareolar is dilated duct with debris and the other which is directly behind areola is dilated duct differential includes papilloma. she is here to discuss options. she continues to have spontaneous clear/yellow dc from left nipple   Past Surgical History Illene Regulus, CMA; 11/12/2017 8:51 AM) No pertinent past surgical history   Diagnostic Studies History Illene Regulus, CMA; 11/12/2017 8:51 AM) Mammogram  within last year Pap Smear  1-5 years ago  Allergies Lars Mage Spillers, CMA; 11/12/2017 8:53 AM) Aspirin *ANALGESICS - NonNarcotic*   Medication History (Alisha Spillers, CMA; 11/12/2017 8:53 AM) No Current Medications Medications Reconciled  Social History Illene Regulus, CMA; 11/12/2017 8:51 AM) Alcohol use  Occasional alcohol use. Caffeine use  Coffee. No drug use  Tobacco use  Former smoker.  Family History Illene Regulus, [place]; 11/12/2017 8:51 AM) Colon Cancer  Father. Colon Polyps  Mother. Diabetes Mellitus  Father. Hypertension  Father, Mother. Thyroid problems  Sister.  Pregnancy / Birth History Illene Regulus, CMA; 11/12/2017 8:51 AM) Age at menarche  81 years. Gravida  2 Length (months) of breastfeeding  12-24 Maternal age  9-35 Para  2   Review of Systems Lars Mage Spillers CMA; 11/12/2017 8:51 AM) General Not Present- Appetite Loss, Chills, Fatigue, Fever,  Night Sweats, Weight Gain and Weight Loss. Skin Not Present- Change in Wart/Mole, Dryness, Hives, Jaundice, New Lesions, Non-Healing Wounds, Rash and Ulcer. HEENT Not Present- Earache, Hearing Loss, Hoarseness, Nose Bleed, Oral Ulcers, Ringing in the Ears, Seasonal Allergies, Sinus Pain, Sore Throat, Visual Disturbances, Wears glasses/contact lenses and Yellow Eyes. Respiratory Not Present- Bloody sputum, Chronic Cough, Difficulty Breathing, Snoring and Wheezing.  Vitals (Alisha Spillers CMA; 11/12/2017 8:52 AM) 11/12/2017 8:52 AM Weight: 151 lb Height: 64in Body Surface Area: 1.74 m Body Mass Index: 25.92 kg/m  Pulse: 80 (Regular)  BP: 126/82 (Sitting, Left Arm, Standard) Physical Exam Rolm Bookbinder MD; 11/12/2017 9:55 AM) General Mental Status-Alert. Head and Neck Trachea-midline. Thyroid Gland Characteristics - normal size and consistency. Eye Sclera/Conjunctiva - Bilateral-No scleral icterus. Chest and Lung Exam Chest and lung exam reveals -quiet, even and easy respiratory effort with no use of accessory muscles. Breast Nipples Discharge - Left - Note: single lateral duct clear/yellow dc expressed from nipple. Discharge - Right - None. Breast Lump-No Palpable Breast Mass. Neurologic Neurologic evaluation reveals -alert and oriented x 3 with no impairment of recent or remote memory. Lymphatic Head & Neck General Head & Neck Lymphatics: Bilateral - Description - Normal. Axillary General Axillary Region: Bilateral - Description - Normal. Note: no Fritch adenopathy   Assessment & Plan Rolm Bookbinder MD; 11/12/2017 9:53 AM) Lonell Face DISCHARGE FROM LEFT NIPPLE (N64.52) Story: Left breast seed guided excisional biopsy with directed duct excision discussed options including observation. I think with bloody dc at her age and possible mass reasonable to consider excision. she does have pathologic discharge. will place seed at site of papilloma biopsy and  then remove discharging ductal system as well as the biopsy site. discussed that other clip may be left. discussed incision, recovery and risks. discussed small chance of upgrade. will proceed soon

## 2017-12-06 NOTE — Op Note (Signed)
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 Serita Grammes Anesthesia: General Estimated blood loss: Minimal Complications: None Drains: None Specimens: Left breast tissue marked with paint containing seed and 2 clips Sponge needle count was correct at completion This patient to recovery in stable condition  Indications: This is a healthy 42 year old female who has had left breast bloody nipple discharge.  She has been evaluated and was found to have a retroareolar mass.  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  We discussed options and elected to do a seed guided excisional biopsy as well as ensuring that the discharging duct was removed as well.  Procedure: She first had a seed placed at the breast center.  I had these mammograms in the operating room.  After informed consent was obtained she was then taken to the operating room.  She was given antibiotics.  SCDs were in place.  She was then placed under general anesthesia without complication.  Her breast was prepped and draped in the standard sterile surgical fashion.  A surgical timeout was then performed.  I located the seed in the retroareolar position.  I infiltrated Marcaine.  I access the single discharging duct with a lacrimal duct probe.  I then made a periareolar incision in order to hide the scar later.  I dissected under the nipple areolar complex.  I located the seed with the neoprobe and I found the lacrimal duct probe to ensure that I removed the discharging duct system.  I then used cautery to excise the seed and some of the surrounding tissue.  This was marked with paint and passed off the table.  Mammogram confirmed removal of 2 clips in the radioactive seed.  I then obtained hemostasis.  I closed this with 2-0 Vicryl, 3-0 Vicryl, and 5-0  Monocryl.  Glue and Steri-Strips were applied.  She tolerated this well was extubated and transferred to recovery stable.

## 2017-12-07 ENCOUNTER — Encounter (HOSPITAL_BASED_OUTPATIENT_CLINIC_OR_DEPARTMENT_OTHER): Payer: Self-pay | Admitting: General Surgery

## 2018-01-16 LAB — HM PAP SMEAR: HM Pap smear: NEGATIVE

## 2018-01-21 ENCOUNTER — Encounter: Payer: Self-pay | Admitting: Family Medicine

## 2018-01-21 ENCOUNTER — Ambulatory Visit (INDEPENDENT_AMBULATORY_CARE_PROVIDER_SITE_OTHER): Payer: 59 | Admitting: Family Medicine

## 2018-01-21 VITALS — BP 144/90 | HR 82 | Wt 151.8 lb

## 2018-01-21 DIAGNOSIS — I10 Essential (primary) hypertension: Secondary | ICD-10-CM

## 2018-01-21 LAB — POCT URINALYSIS DIP (PROADVANTAGE DEVICE)
BILIRUBIN UA: NEGATIVE mg/dL
Bilirubin, UA: NEGATIVE
Blood, UA: NEGATIVE
Glucose, UA: NEGATIVE mg/dL
LEUKOCYTES UA: NEGATIVE
Nitrite, UA: NEGATIVE
PH UA: 7.5 (ref 5.0–8.0)
PROTEIN UA: NEGATIVE mg/dL
SPECIFIC GRAVITY, URINE: 1.015
Urobilinogen, Ur: NEGATIVE

## 2018-01-21 LAB — POCT URINE PREGNANCY: Preg Test, Ur: NEGATIVE

## 2018-01-21 MED ORDER — OLMESARTAN MEDOXOMIL 20 MG PO TABS: 20.0000 mg | ORAL_TABLET | Freq: Every day | ORAL | 2 refills | Status: DC

## 2018-01-21 MED FILL — OLMESARTAN MEDOXOMIL 20 MG: 20 | 30 days supply | Qty: 30 | Fill #0

## 2018-01-21 NOTE — Progress Notes (Signed)
Subjective:    Patient ID: Sharon Beck, female    DOB: 01-23-1975, 43 y.o.   MRN: [id number]  HPI Chief Complaint  Patient presents with  . elevated blood pressure    elevated blood pressure up since surgery back in november. some headaches. highest 178/100 dec 26th-  lowest- dec 27 - 132/86    She is here with concerns regarding elevated blood pressure for the past 6 weeks without a history of HTN. Reports having elevated BP for only a couple of days after her last child was born years ago.   States she had surgery in November and her BP was 160/100. She has been checking it at home since mid-December. Readings have been from 132/86 to 170/100. She really has not seen any BP readings in normal range. 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   Diet is not high in sodium. No changes in activity level. Does not smoke. No new medications or supplements or increase in caffeine.  Denies any changes in sleep, mood.  Parents both have HTN.   Denies fever, chills, dizziness, chest pain, palpitations, shortness of breath, abdominal pain, N/V/D, LE edema.   She does report having some tension type headaches lately. No vision changes, tinnitus.   Reviewed allergies, medications, past medical, surgical, family, and social history.   Review of Systems Pertinent positives and negatives in the history of present illness.     Objective:   Physical Exam Constitutional:      General: She is not in acute distress.    Appearance: Normal appearance. She is normal weight.  HENT:     Mouth/Throat:     Mouth: Mucous membranes are moist.     Pharynx: Oropharynx is clear.  Eyes:     General: Lids are normal.     Conjunctiva/sclera: Conjunctivae normal.     Pupils: Pupils are equal, round, and reactive to light.  Neck:     Musculoskeletal: Normal range of motion and neck supple.  Cardiovascular:     Rate and Rhythm: Normal rate and regular rhythm.     Pulses: Normal  pulses.     Heart sounds: Normal heart sounds.     Comments: No LE edema  Pulmonary:     Effort: Pulmonary effort is normal.     Breath sounds: Normal breath sounds.  Skin:    General: Skin is warm and dry.  Neurological:     General: No focal deficit present.     Mental Status: She is alert and oriented to person, place, and time.     Cranial Nerves: Cranial nerves are intact.     Sensory: Sensation is intact.     Motor: Motor function is intact.  Psychiatric:        Mood and Affect: Mood normal.        Speech: Speech normal.        Behavior: Behavior normal.        Thought Content: Thought content normal.        Judgment: Judgment normal.    BP (!) 144/90   Pulse 82   Wt 151 lb 12.8 oz (68.9 kg)   SpO2 97%   BMI 26.06 kg/m   UPT negative UA negative      Assessment & Plan:  Elevated blood pressure reading in office with diagnosis of hypertension - Plan: CBC with Differential/Platelet, Comprehensive metabolic panel, POCT Urinalysis DIP (Proadvantage Device), TSH, T4, free, Lipid panel, olmesartan (BENICAR)  20 MG tablet, POCT urine pregnancy  Denies history of HTN until 6 weeks ago when her BP was elevated for a surgery. Since then, she has been checking it and she now has a 6 week history of significantly elevated BP.  Discussed possible secondary causes of HTN. She does have a family history of HTN.  We will start her on Benicar after discussing other classes of HTN medications. DASH diet handout given. Counseling done on HTN, possible causes and management.  Discussed that headaches she has been having are not related but we should monitor.  Check labs. Continue to monitor BP at home and follow up in 4 weeks.

## 2018-01-21 NOTE — Patient Instructions (Addendum)
Start olmesartan as discussed. See the DASH diet below.  Continue to monitor your BP at home. Goal is <130/80  Follow up in 4 weeks and bring in your readings.     Olmesartan tablets What is this medicine? OLMESARTAN (all mi SAR tan) is used to treat high blood pressure. This medicine may be used for other purposes; ask your health care provider or pharmacist if you have questions. COMMON BRAND NAME(S): Benicar What should I tell my health care provider before I take this medicine? They need to know if you have any of these conditions: -if you are on a special diet, such as a low-salt diet -kidney or liver disease -an unusual or allergic reaction to olmesartan, other medicines, foods, dyes, or preservatives -pregnant or trying to get pregnant -breast-feeding How should I use this medicine? Take this medicine by mouth with a glass of water. Follow the directions on the prescription label. This medicine can be taken with or without food. Take your doses at regular intervals. Do not take your medicine more often than directed. Do not stop taking except on the advice of your doctor or health care professional. Talk to your pediatrician regarding the use of this medicine in children. While this drug may be prescribed for children as young as 6 years for selected conditions, precautions do apply. Overdosage: If you think you have taken too much of this medicine contact a poison control center or emergency room at once. NOTE: This medicine is only for you. Do not share this medicine with others. What if I miss a dose? If you miss a dose, take it as soon as you can. If it is almost time for your next dose, take only that dose. Do not take double or extra doses. What may interact with this medicine? -blood pressure medicines -diuretics, especially triamterene, spironolactone or amiloride -potassium salts or potassium supplements This list may not describe all possible interactions. Give your  health care provider a list of all the medicines, herbs, non-prescription drugs, or dietary supplements you use. Also tell them if you smoke, drink alcohol, or use illegal drugs. Some items may interact with your medicine. What should I watch for while using this medicine? Visit your doctor or health care professional for regular checks on your progress. Check your blood pressure as directed. Ask your doctor or health care professional what your blood pressure should be and when you should contact him or her. Call your doctor or health care professional if you notice an irregular or fast heart beat. Women should inform their doctor if they wish to become pregnant or think they might be pregnant. There is a potential for serious side effects to an unborn child, particularly in the second or third trimester. Talk to your health care professional or pharmacist for more information. You may get drowsy or dizzy. Do not drive, use machinery, or do anything that needs mental alertness until you know how this drug affects you. Do not stand or sit up quickly, especially if you are an older patient. This reduces the risk of dizzy or fainting spells. Alcohol can make you more drowsy and dizzy. Avoid alcoholic drinks. Avoid salt substitutes unless you are told otherwise by your doctor or health care professional. Do not treat yourself for coughs, colds, or pain while you are taking this medicine without asking your doctor or health care professional for advice. Some ingredients may increase your blood pressure. What side effects may I notice from receiving this medicine? Side  effects that you should report to your doctor or health care professional as soon as possible: -confusion, dizziness, light headedness or fainting spells -decreased amount of urine passed -diarrhea -difficulty breathing or swallowing, hoarseness, or tightening of the throat -fast or irregular heart beat, palpitations, or chest pain -skin  rash, itching -swelling of your face, lips, tongue, hands, or feet -vomiting -weight loss Side effects that usually do not require medical attention (report to your doctor or health care professional if they continue or are bothersome): -cough -decreased sexual function or desire -headache -nasal congestion or stuffiness -nausea -sore or cramping muscles This list may not describe all possible side effects. Call your doctor for medical advice about side effects. You may report side effects to FDA at 1-800-FDA-1088. Where should I keep my medicine? Keep out of the reach of children. Store your medicine at room temperature between 20 and 25 degrees C (68 and 77 degrees F). Throw away any unused medicine after the expiration date. NOTE: This sheet is a summary. It may not cover all possible information. If you have questions about this medicine, talk to your doctor, pharmacist, or health care provider.  2019 Elsevier/Gold Standard (2011-07-19 13:02:23)    DASH Eating Plan DASH stands for "Dietary Approaches to Stop Hypertension." The DASH eating plan is a healthy eating plan that has been shown to reduce high blood pressure (hypertension). It may also reduce your risk for type 2 diabetes, heart disease, and stroke. The DASH eating plan may also help with weight loss. What are tips for following this plan?  General guidelines  Avoid eating more than 2,300 mg (milligrams) of salt (sodium) a day. If you have hypertension, you may need to reduce your sodium intake to 1,500 mg a day.  Limit alcohol intake to no more than 1 drink a day for nonpregnant women and 2 drinks a day for men. One drink equals 12 oz of beer, 5 oz of wine, or 1 oz of hard liquor.  Work with your health care provider to maintain a healthy body weight or to lose weight. Ask what an ideal weight is for you.  Get at least 30 minutes of exercise that causes your heart to beat faster (aerobic exercise) most days of the  week. Activities may include walking, swimming, or biking.  Work with your health care provider or diet and nutrition specialist (dietitian) to adjust your eating plan to your individual calorie needs. Reading food labels   Check food labels for the amount of sodium per serving. Choose foods with less than 5 percent of the Daily Value of sodium. Generally, foods with less than 300 mg of sodium per serving fit into this eating plan.  To find whole grains, look for the word "whole" as the first word in the ingredient list. Shopping  Buy products labeled as "low-sodium" or "no salt added."  Buy fresh foods. Avoid canned foods and premade or frozen meals. Cooking  Avoid adding salt when cooking. Use salt-free seasonings or herbs instead of table salt or sea salt. Check with your health care provider or pharmacist before using salt substitutes.  Do not fry foods. Cook foods using healthy methods such as baking, boiling, grilling, and broiling instead.  Cook with heart-healthy oils, such as olive, canola, soybean, or sunflower oil. Meal planning  Eat a balanced diet that includes: ? 5 or more servings of fruits and vegetables each day. At each meal, try to fill half of your plate with fruits and vegetables. ?  Up to 6-8 servings of whole grains each day. ? Less than 6 oz of lean meat, poultry, or fish each day. A 3-oz serving of meat is about the same size as a deck of cards. One egg equals 1 oz. ? 2 servings of low-fat dairy each day. ? A serving of nuts, seeds, or beans 5 times each week. ? Heart-healthy fats. Healthy fats called Omega-3 fatty acids are found in foods such as flaxseeds and coldwater fish, like sardines, salmon, and mackerel.  Limit how much you eat of the following: ? Canned or prepackaged foods. ? Food that is high in trans fat, such as fried foods. ? Food that is high in saturated fat, such as fatty meat. ? Sweets, desserts, sugary drinks, and other foods with added  sugar. ? Full-fat dairy products.  Do not salt foods before eating.  Try to eat at least 2 vegetarian meals each week.  Eat more home-cooked food and less restaurant, buffet, and fast food.  When eating at a restaurant, ask that your food be prepared with less salt or no salt, if possible. What foods are recommended? The items listed may not be a complete list. Talk with your dietitian about what dietary choices are best for you. Grains Whole-grain or whole-wheat bread. Whole-grain or whole-wheat pasta. Brown rice. [place]. Bulgur. Whole-grain and low-sodium cereals. Pita bread. Low-fat, low-sodium crackers. Whole-wheat flour tortillas. Vegetables Fresh or frozen vegetables (raw, steamed, roasted, or grilled). Low-sodium or reduced-sodium tomato and vegetable juice. Low-sodium or reduced-sodium tomato sauce and tomato paste. Low-sodium or reduced-sodium canned vegetables. Fruits All fresh, dried, or frozen fruit. Canned fruit in natural juice (without added sugar). Meat and other protein foods Skinless chicken or [place]. Ground chicken or [place]. Pork with fat trimmed off. Fish and seafood. Egg whites. Dried beans, peas, or lentils. Unsalted nuts, nut butters, and seeds. Unsalted canned beans. Lean cuts of beef with fat trimmed off. Low-sodium, lean deli meat. Dairy Low-fat (1%) or fat-free (skim) milk. Fat-free, low-fat, or reduced-fat cheeses. Nonfat, low-sodium ricotta or cottage cheese. Low-fat or nonfat yogurt. Low-fat, low-sodium cheese. Fats and oils Soft margarine without trans fats. Vegetable oil. Low-fat, reduced-fat, or light mayonnaise and salad dressings (reduced-sodium). Canola, safflower, olive, soybean, and sunflower oils. Avocado. Seasoning and other foods Herbs. Spices. Seasoning mixes without salt. Unsalted popcorn and pretzels. Fat-free sweets. What foods are not recommended? The items listed may not be a complete list. Talk with your dietitian about what  dietary choices are best for you. Grains Baked goods made with fat, such as croissants, muffins, or some breads. Dry pasta or rice meal packs. Vegetables Creamed or fried vegetables. Vegetables in a cheese sauce. Regular canned vegetables (not low-sodium or reduced-sodium). Regular canned tomato sauce and paste (not low-sodium or reduced-sodium). Regular tomato and vegetable juice (not low-sodium or reduced-sodium). Angie Fava. Olives. Fruits Canned fruit in a light or heavy syrup. Fried fruit. Fruit in cream or butter sauce. Meat and other protein foods Fatty cuts of meat. Ribs. Fried meat. Berniece Salines. Sausage. Bologna and other processed lunch meats. Salami. Fatback. Hotdogs. Bratwurst. Salted nuts and seeds. Canned beans with added salt. Canned or smoked fish. Whole eggs or egg yolks. Chicken or [place] with skin. Dairy Whole or 2% milk, cream, and half-and-half. Whole or full-fat cream cheese. Whole-fat or sweetened yogurt. Full-fat cheese. Nondairy creamers. Whipped toppings. Processed cheese and cheese spreads. Fats and oils Butter. Stick margarine. Lard. Shortening. Ghee. Bacon fat. Tropical oils, such as coconut, palm kernel, or palm oil.  Seasoning and other foods Salted popcorn and pretzels. Onion salt, garlic salt, seasoned salt, table salt, and sea salt. Worcestershire sauce. Tartar sauce. Barbecue sauce. Teriyaki sauce. Soy sauce, including reduced-sodium. Steak sauce. Canned and packaged gravies. Fish sauce. Oyster sauce. Cocktail sauce. Horseradish that you find on the shelf. Ketchup. Mustard. Meat flavorings and tenderizers. Bouillon cubes. Hot sauce and Tabasco sauce. Premade or packaged marinades. Premade or packaged taco seasonings. Relishes. Regular salad dressings. Where to find more information:  National Heart, Lung, and [place]: https://wilson-eaton.com/  American Heart Association: www.heart.org Summary  The DASH eating plan is a healthy eating plan that has been shown to reduce  high blood pressure (hypertension). It may also reduce your risk for type 2 diabetes, heart disease, and stroke.  With the DASH eating plan, you should limit salt (sodium) intake to 2,300 mg a day. If you have hypertension, you may need to reduce your sodium intake to 1,500 mg a day.  When on the DASH eating plan, aim to eat more fresh fruits and vegetables, whole grains, lean proteins, low-fat dairy, and heart-healthy fats.  Work with your health care provider or diet and nutrition specialist (dietitian) to adjust your eating plan to your individual calorie needs. This information is not intended to replace advice given to you by your health care provider. Make sure you discuss any questions you have with your health care provider. Document Released: 12/22/2010 Document Revised: 12/27/2015 Document Reviewed: 12/27/2015 Elsevier Interactive Patient Education  2019 [employer].

## 2018-01-22 LAB — CBC WITH DIFFERENTIAL/PLATELET
BASOS ABS: 0.1 10*3/uL (ref 0.0–0.2)
Basos: 1 %
EOS (ABSOLUTE): 0.2 10*3/uL (ref 0.0–0.4)
Eos: 3 %
HEMOGLOBIN: 14.2 g/dL (ref 11.1–15.9)
Hematocrit: 42.1 % (ref 34.0–46.6)
Immature Grans (Abs): 0 10*3/uL (ref 0.0–0.1)
Immature Granulocytes: 0 %
LYMPHS ABS: 1.8 10*3/uL (ref 0.7–3.1)
Lymphs: 39 %
MCH: 30.7 pg (ref 26.6–33.0)
MCHC: 33.7 g/dL (ref 31.5–35.7)
MCV: 91 fL (ref 79–97)
MONOS ABS: 0.3 10*3/uL (ref 0.1–0.9)
Monocytes: 6 %
Neutrophils Absolute: 2.3 10*3/uL (ref 1.4–7.0)
Neutrophils: 51 %
PLATELETS: 288 10*3/uL (ref 150–450)
RBC: 4.62 x10E6/uL (ref 3.77–5.28)
RDW: 12 % (ref 11.7–15.4)
WBC: 4.6 10*3/uL (ref 3.4–10.8)

## 2018-01-22 LAB — COMPREHENSIVE METABOLIC PANEL
ALBUMIN: 4.8 g/dL (ref 3.5–5.5)
ALK PHOS: 56 IU/L (ref 39–117)
ALT: 20 IU/L (ref 0–32)
AST: 13 IU/L (ref 0–40)
Albumin/Globulin Ratio: 2.1 (ref 1.2–2.2)
BILIRUBIN TOTAL: 0.6 mg/dL (ref 0.0–1.2)
BUN / CREAT RATIO: 18 (ref 9–23)
BUN: 13 mg/dL (ref 6–24)
CHLORIDE: 102 mmol/L (ref 96–106)
CO2: 23 mmol/L (ref 20–29)
Calcium: 9.8 mg/dL (ref 8.7–10.2)
Creatinine, Ser: 0.74 mg/dL (ref 0.57–1.00)
GFR calc Af Amer: 117 mL/min/{1.73_m2} (ref >59)
GFR calc non Af Amer: 102 mL/min/{1.73_m2} (ref >59)
GLUCOSE: 85 mg/dL (ref 65–99)
Globulin, Total: 2.3 g/dL (ref 1.5–4.5)
Potassium: 4.5 mmol/L (ref 3.5–5.2)
Sodium: 140 mmol/L (ref 134–144)
Total Protein: 7.1 g/dL (ref 6.0–8.5)

## 2018-01-22 LAB — TSH: TSH: 1.45 u[IU]/mL (ref 0.450–4.500)

## 2018-01-22 LAB — LIPID PANEL
CHOLESTEROL TOTAL: 201 mg/dL — AB (ref 100–199)
Chol/HDL Ratio: 3.4 ratio (ref 0.0–4.4)
HDL: 60 mg/dL (ref >39)
LDL Calculated: 121 mg/dL — ABNORMAL HIGH (ref 0–99)
Triglycerides: 99 mg/dL (ref 0–149)
VLDL Cholesterol Cal: 20 mg/dL (ref 5–40)

## 2018-01-22 LAB — T4, FREE: FREE T4: 1.08 ng/dL (ref 0.82–1.77)

## 2018-02-18 ENCOUNTER — Encounter: Payer: Self-pay | Admitting: Family Medicine

## 2018-02-18 ENCOUNTER — Ambulatory Visit (INDEPENDENT_AMBULATORY_CARE_PROVIDER_SITE_OTHER): Payer: 59 | Admitting: Family Medicine

## 2018-02-18 VITALS — BP 120/70 | HR 80 | Wt 152.6 lb

## 2018-02-18 DIAGNOSIS — I10 Essential (primary) hypertension: Secondary | ICD-10-CM | POA: Diagnosis not present

## 2018-02-18 HISTORY — DX: Essential (primary) hypertension: I10

## 2018-02-18 MED ORDER — LOSARTAN POTASSIUM 50 MG PO TABS: 50.0000 mg | ORAL_TABLET | Freq: Every day | ORAL | 2 refills | Status: DC

## 2018-02-18 MED FILL — LOSARTAN POTASSIUM 50 MG TA: 50 | 30 days supply | Qty: 30 | Fill #0

## 2018-02-18 NOTE — Progress Notes (Signed)
   Subjective:    Patient ID: Sharon Beck, female    DOB: 09/10/1975, 43 y.o.   MRN: [id number]  HPI Chief Complaint  Patient presents with  . 4 week follow-up    4 week follow-up on HTn. 120/70-80   She is here for 4-week follow-up on hypertension which is a new diagnosis.  She started on olmesartan at her previous visit. Checking blood pressures at home and they have been in the 120s over 70s range.  No side effects and no concerns however she does not like being on a daily medication.  Request that I change the olmesartan to a more affordable one and she does have a list with her.  Denies fever, chills, headache, dizziness, chest pain, palpitations, shortness of breath, abdominal pain, nausea, vomiting.  Reviewed allergies, medications, past medical, surgical, family, and social history.   Review of Systems Pertinent positives and negatives in the history of present illness.     Objective:   Physical Exam BP 120/70   Pulse 80   Wt 152 lb 9.6 oz (69.2 kg)   BMI 26.19 kg/m   Alert and oriented and in no acute distress.  Not otherwise examined.      Assessment & Plan:  Essential hypertension - Plan: losartan (COZAAR) 50 MG tablet  Her blood pressure has responded well to the medication.  She requests switching from olmesartan to a different one in the class that is better covered by her insurance.  We will switch her to losartan. Continue on medication for now.  Continue checking her blood pressure sporadically. She will let me know when she is due for refill as to what her blood pressures are looking like and if she is doing well I will refill and see her back in 6 months.  If she sees any elevated readings on a consistent basis then she will return to see me.

## 2018-03-28 MED FILL — LOSARTAN POTASSIUM 50 MG TA: 50 | 30 days supply | Qty: 30 | Fill #1

## 2018-04-20 ENCOUNTER — Encounter: Payer: Self-pay | Admitting: Family Medicine

## 2018-04-24 ENCOUNTER — Other Ambulatory Visit: Payer: Self-pay

## 2018-04-24 ENCOUNTER — Encounter: Payer: Self-pay | Admitting: Family Medicine

## 2018-04-24 ENCOUNTER — Ambulatory Visit (INDEPENDENT_AMBULATORY_CARE_PROVIDER_SITE_OTHER): Payer: 59 | Admitting: Family Medicine

## 2018-04-24 VITALS — BP 117/75 | Wt 148.0 lb

## 2018-04-24 DIAGNOSIS — I10 Essential (primary) hypertension: Secondary | ICD-10-CM | POA: Diagnosis not present

## 2018-04-24 NOTE — Progress Notes (Signed)
   Subjective:    Patient ID: Sharon Beck, female    DOB: Aug 28, 1975, 43 y.o.   MRN: [id number]  HPI Chief Complaint  Patient presents with  . follow-up    follow-up on blood pressure. 115-120/70-80   Documentation for virtual audio and video telecommunications through Zoom encounter:  The patient was located at home. 2 patient identifiers used.  The provider was located in the office. The patient did consent to this visit and is aware of possible charges through their insurance for this visit.  The other persons participating in this telemedicine service were none.  Follow up on HTN and starting on medication for the first time in January.  Taking Losartan 25 mg daily. No side effects.   Checking BP at home and readings are in goal range consistently.   Denies fever, chills, dizziness, chest pain, palpitations, shortness of breath, abdominal pain, N/V/D, urinary symptoms, LE edema.   Reviewed allergies, medications, past medical, surgical, family, and social history.    Review of Systems Pertinent positives and negatives in the history of present illness.     Objective:   Physical Exam BP 117/75   Wt 148 lb (67.1 kg)   BMI 25.40 kg/m   Alert and oriented and in no acute distress.  Pleasant and well-appearing       Assessment & Plan:  Essential hypertension  Blood pressure is well controlled on losartan 25 mg.  She is eating a healthy diet and exercising.  She has no new concerns.  Continue on current medication and spot checking blood pressure.  She will follow-up as scheduled for her CPE in November or sooner if blood pressures are not continuing to be controlled.

## 2018-06-04 MED FILL — LOSARTAN POTASSIUM 50 MG TA: 50 | 30 days supply | Qty: 30 | Fill #2

## 2018-06-24 DIAGNOSIS — Z01419 Encounter for gynecological examination (general) (routine) without abnormal findings: Secondary | ICD-10-CM | POA: Diagnosis not present

## 2018-06-24 DIAGNOSIS — Z1231 Encounter for screening mammogram for malignant neoplasm of breast: Secondary | ICD-10-CM | POA: Diagnosis not present

## 2018-06-24 DIAGNOSIS — Z6825 Body mass index (BMI) 25.0-25.9, adult: Secondary | ICD-10-CM | POA: Diagnosis not present

## 2018-06-24 LAB — HM MAMMOGRAPHY

## 2018-07-02 LAB — HM DIABETES EYE EXAM

## 2018-08-22 ENCOUNTER — Encounter: Payer: Self-pay | Admitting: Family Medicine

## 2018-08-23 ENCOUNTER — Other Ambulatory Visit: Payer: Self-pay

## 2018-08-23 DIAGNOSIS — I10 Essential (primary) hypertension: Secondary | ICD-10-CM

## 2018-08-23 MED ORDER — LOSARTAN POTASSIUM 50 MG PO TABS: 50.0000 mg | ORAL_TABLET | Freq: Every day | ORAL | 2 refills | Status: DC

## 2018-08-23 MED FILL — LOSARTAN POTASSIUM 50 MG TA: 50 | 30 days supply | Qty: 30 | Fill #0

## 2018-10-26 DIAGNOSIS — Z23 Encounter for immunization: Secondary | ICD-10-CM | POA: Diagnosis not present

## 2018-10-29 MED FILL — LOSARTAN POTASSIUM 50 MG TA: 50 | 30 days supply | Qty: 30 | Fill #1

## 2018-11-18 ENCOUNTER — Other Ambulatory Visit: Payer: Self-pay

## 2018-11-18 ENCOUNTER — Encounter: Payer: Self-pay | Admitting: Family Medicine

## 2018-11-18 ENCOUNTER — Ambulatory Visit (INDEPENDENT_AMBULATORY_CARE_PROVIDER_SITE_OTHER): Payer: 59 | Admitting: Family Medicine

## 2018-11-18 VITALS — BP 128/70 | HR 81 | Temp 97.3°F | Ht 65.25 in | Wt 149.0 lb

## 2018-11-18 DIAGNOSIS — Z8632 Personal history of gestational diabetes: Secondary | ICD-10-CM | POA: Diagnosis not present

## 2018-11-18 DIAGNOSIS — E7800 Pure hypercholesterolemia, unspecified: Secondary | ICD-10-CM | POA: Diagnosis not present

## 2018-11-18 DIAGNOSIS — I10 Essential (primary) hypertension: Secondary | ICD-10-CM | POA: Diagnosis not present

## 2018-11-18 DIAGNOSIS — M25551 Pain in right hip: Secondary | ICD-10-CM

## 2018-11-18 DIAGNOSIS — Z0000 Encounter for general adult medical examination without abnormal findings: Secondary | ICD-10-CM | POA: Diagnosis not present

## 2018-11-18 NOTE — Patient Instructions (Signed)

## 2018-11-18 NOTE — Progress Notes (Signed)
Subjective:    Patient ID: Sharon Beck, female    DOB: 07/26/1975, 43 y.o.   MRN: [id number]  HPI Chief Complaint  Patient presents with  . fasting cpe    fasting cpe, sees obgyn   She is new to the practice and here for a complete physical exam and to follow up on HTN.   Complains of right anterior hip pain for the past 1-2 months. No injury. Pain is intermittent and non radiating. Pain is with standing long periods and occasionally at night. Does not have pain often and it is not interfering with her daily activities. She is not taking anything for her pain.   Sleeping on a heating pad helps.  Sitting more since working from home. She had a stand- up desk at her office.  Standing more now and her hip feels better.  No numbness, tingling or weakness in LEs.   Sits with her legs crossed often. She has not been able to do this as often lately.   Other providers: Dr. Donne Hazel- surgeon [facility] - Dr. Arlana Pouch at [facility]  [facility]    Social history: Lives with husband and 2 kids, works as Psychologist, educational for Hosp[employer]   Diet: fairly healthy. She is snacking more  Excerise: none   Immunizations: Tdap UTD. Flu shot at work.   Health maintenance:  Mammogram: 01/2018 Colonoscopy: never  Last Gynecological Exam: 01/2018 Last Menstrual cycle: 11/16/18 Last Dental Exam: 2 weeks ago  Last Eye Exam: in the past year   Wears seatbelt always, smoke detectors in home and functioning, does not text while driving and feels safe in home environment.   Reviewed allergies, medications, past medical, surgical, family, and social history.   Review of Systems Review of Systems Constitutional: -fever, -chills, -sweats, -unexpected weight change,-fatigue ENT: -runny nose, -ear pain, -sore throat Cardiology:  -chest pain, -palpitations, -edema Respiratory: -cough, -shortness of breath, -wheezing Gastroenterology: -abdominal pain, -nausea,  -vomiting, -diarrhea, -constipation  Hematology: -bleeding or bruising problems Musculoskeletal: -arthralgias, -myalgias, -joint swelling, -back pain Ophthalmology: -vision changes Urology: -dysuria, -difficulty urinating, -hematuria, -urinary frequency, -urgency Neurology: -headache, -weakness, -tingling, -numbness       Objective:   Physical Exam BP 128/70   Pulse 81   Temp (!) 97.3 F (36.3 C)   Ht 5' 5.25" (1.657 m)   Wt 149 lb (67.6 kg)   LMP 11/16/2018   BMI 24.61 kg/m   General Appearance:    Alert, cooperative, no distress, appears stated age  Head:    Normocephalic, without obvious abnormality, atraumatic  Eyes:    PERRL, conjunctiva/corneas clear, EOM's intact   Ears:    Normal TM's and external ear canals  Nose:   Mask in place   Throat:   Mask in place   Neck:   Supple, no lymphadenopathy;  thyroid:  no   Enlargement/tenderness/nodules   Back:    Spine nontender, no curvature, ROM normal, no CVA     tenderness  Lungs:     Clear to auscultation bilaterally without wheezes, rales or     ronchi; respirations unlabored  Chest Wall:    No tenderness or deformity   Heart:    Regular rate and rhythm, S1 and S2 normal, no murmur, rub   or gallop  Breast Exam:    OB/GYN  Abdomen:     Soft, non-tender, nondistended, normoactive bowel sounds,    no masses, no hepatosplenomegaly  Genitalia:    OB/GYN  Extremities:   No clubbing, cyanosis or edema. Right hip exam shows normal ROM and strength. Unable to reproduce her pain.   Pulses:   2+ and symmetric all extremities  Skin:   Skin color, texture, turgor normal, no rashes or lesions  Lymph nodes:   Cervical and supraclavicular nodes normal  Neurologic:   CNII-XII intact, normal strength, sensation and gait; reflexes 2+ and symmetric throughout          Psych:   Normal mood, affect, hygiene and grooming.        Assessment & Plan:  Routine general medical examination at a health care facility - Plan: CBC with  Differential/Platelet, Comprehensive metabolic panel, TSH, Lipid panel -Here today for a fasting CPE.  Preventive healthcare was discussed.  She sees [facility] and is up-to-date on Pap smear and mammogram.  Denies ever having a colonoscopy.  Her father did have colon cancer but she states he was 41 years old at the time of diagnosis.  She denies being depressed or anxious but she does report having "Covid fatigue".  She is working from home and also doing some home schooling.  Counseled on setting aside time for herself and doing things she enjoys.  I encouraged her to schedule time for exercise.  She is eating a healthy diet. Immunizations reviewed and she is up-to-date.  Essential hypertension-well-controlled on losartan 25 mg.  Acute pain of right hip-no red flag symptoms.  Her exam is benign.  Offered to refer to Ortho or send her for an x-ray and she would like to hold off and monitor her symptoms.  Elevated LDL cholesterol level - Plan: Lipid panel-counseling on healthy diet and exercise to help lower cholesterol.  Follow-up pending fasting labs  History of gestational diabetes - Plan: Hemoglobin A1c-previous A1c was normal.  She would like to recheck this.  Follow-up pending results

## 2018-11-19 LAB — CBC WITH DIFFERENTIAL/PLATELET
Basophils Absolute: 0 10*3/uL (ref 0.0–0.2)
Basos: 1 %
EOS (ABSOLUTE): 0.1 10*3/uL (ref 0.0–0.4)
Eos: 3 %
Hematocrit: 40.4 % (ref 34.0–46.6)
Hemoglobin: 13.9 g/dL (ref 11.1–15.9)
Immature Grans (Abs): 0 10*3/uL (ref 0.0–0.1)
Immature Granulocytes: 0 %
Lymphocytes Absolute: 1.3 10*3/uL (ref 0.7–3.1)
Lymphs: 37 %
MCH: 32.2 pg (ref 26.6–33.0)
MCHC: 34.4 g/dL (ref 31.5–35.7)
MCV: 94 fL (ref 79–97)
Monocytes Absolute: 0.3 10*3/uL (ref 0.1–0.9)
Monocytes: 7 %
Neutrophils Absolute: 1.8 10*3/uL (ref 1.4–7.0)
Neutrophils: 52 %
Platelets: 235 10*3/uL (ref 150–450)
RBC: 4.32 x10E6/uL (ref 3.77–5.28)
RDW: 12.1 % (ref 11.7–15.4)
WBC: 3.5 10*3/uL (ref 3.4–10.8)

## 2018-11-19 LAB — COMPREHENSIVE METABOLIC PANEL
ALT: 14 IU/L (ref 0–32)
AST: 18 IU/L (ref 0–40)
Albumin/Globulin Ratio: 2.1 (ref 1.2–2.2)
Albumin: 4.8 g/dL (ref 3.8–4.8)
Alkaline Phosphatase: 56 IU/L (ref 39–117)
BUN/Creatinine Ratio: 14 (ref 9–23)
BUN: 11 mg/dL (ref 6–24)
Bilirubin Total: 0.8 mg/dL (ref 0.0–1.2)
CO2: 22 mmol/L (ref 20–29)
Calcium: 9.4 mg/dL (ref 8.7–10.2)
Chloride: 105 mmol/L (ref 96–106)
Creatinine, Ser: 0.8 mg/dL (ref 0.57–1.00)
GFR calc Af Amer: 106 mL/min/{1.73_m2} (ref >59)
GFR calc non Af Amer: 92 mL/min/{1.73_m2} (ref >59)
Globulin, Total: 2.3 g/dL (ref 1.5–4.5)
Glucose: 90 mg/dL (ref 65–99)
Potassium: 4.5 mmol/L (ref 3.5–5.2)
Sodium: 141 mmol/L (ref 134–144)
Total Protein: 7.1 g/dL (ref 6.0–8.5)

## 2018-11-19 LAB — LIPID PANEL
Chol/HDL Ratio: 3.2 ratio (ref 0.0–4.4)
Cholesterol, Total: 169 mg/dL (ref 100–199)
HDL: 53 mg/dL (ref >39)
LDL Chol Calc (NIH): 100 mg/dL — ABNORMAL HIGH (ref 0–99)
Triglycerides: 88 mg/dL (ref 0–149)
VLDL Cholesterol Cal: 16 mg/dL (ref 5–40)

## 2018-11-19 LAB — TSH: TSH: 1.51 u[IU]/mL (ref 0.450–4.500)

## 2018-11-19 LAB — HEMOGLOBIN A1C
Est. average glucose Bld gHb Est-mCnc: 100 mg/dL
Hgb A1c MFr Bld: 5.1 % (ref 4.8–5.6)

## 2018-11-25 ENCOUNTER — Telehealth: Payer: Self-pay | Admitting: Family Medicine

## 2018-11-25 NOTE — Telephone Encounter (Signed)
Requested records received from [facility].  

## 2018-12-02 ENCOUNTER — Other Ambulatory Visit: Payer: Self-pay

## 2018-12-02 ENCOUNTER — Encounter: Payer: Self-pay | Admitting: Internal Medicine

## 2018-12-10 ENCOUNTER — Other Ambulatory Visit: Payer: Self-pay

## 2018-12-10 DIAGNOSIS — Z20822 Contact with and (suspected) exposure to covid-19: Secondary | ICD-10-CM

## 2018-12-11 ENCOUNTER — Telehealth: Payer: Self-pay | Admitting: Family Medicine

## 2018-12-11 NOTE — Telephone Encounter (Signed)
Requested records received from [facility]

## 2018-12-12 LAB — NOVEL CORONAVIRUS, NAA: SARS-CoV-2, NAA: NOT DETECTED

## 2018-12-30 ENCOUNTER — Encounter: Payer: Self-pay | Admitting: Internal Medicine

## 2018-12-30 MED FILL — LOSARTAN POTASSIUM 50 MG TA: 50 | 30 days supply | Qty: 30 | Fill #2

## 2019-02-17 ENCOUNTER — Encounter: Payer: Self-pay | Admitting: Family Medicine

## 2019-02-20 ENCOUNTER — Other Ambulatory Visit: Payer: Self-pay | Admitting: Family Medicine

## 2019-02-20 DIAGNOSIS — I10 Essential (primary) hypertension: Secondary | ICD-10-CM

## 2019-02-20 MED FILL — LOSARTAN POTASSIUM 50 MG TA: 50 | 30 days supply | Qty: 15 | Fill #0

## 2019-03-07 ENCOUNTER — Ambulatory Visit: Payer: 59 | Admitting: Family Medicine

## 2019-03-25 MED FILL — LOSARTAN POTASSIUM 50 MG TA: 50 | 30 days supply | Qty: 15 | Fill #1

## 2019-04-30 MED FILL — LOSARTAN POTASSIUM 50 MG TA: 50 | 30 days supply | Qty: 15 | Fill #2

## 2019-07-03 MED FILL — LOSARTAN POTASSIUM 50 MG TA: 50 | 90 days supply | Qty: 45 | Fill #3

## 2019-08-12 ENCOUNTER — Encounter: Payer: Self-pay | Admitting: Family Medicine

## 2019-09-08 LAB — HM MAMMOGRAPHY

## 2019-10-06 ENCOUNTER — Other Ambulatory Visit: Payer: Self-pay | Admitting: Family Medicine

## 2019-10-06 DIAGNOSIS — I10 Essential (primary) hypertension: Secondary | ICD-10-CM

## 2019-10-06 MED FILL — LOSARTAN POTASSIUM 50 MG TA: 50 | 90 days supply | Qty: 45 | Fill #0

## 2019-10-06 NOTE — Telephone Encounter (Signed)
Received fax for a refill on the pts. Losartan pt. Last apt was 03/07/19 and 11/19/19.

## 2019-11-19 ENCOUNTER — Encounter: Payer: Self-pay | Admitting: Family Medicine

## 2019-11-19 ENCOUNTER — Ambulatory Visit (INDEPENDENT_AMBULATORY_CARE_PROVIDER_SITE_OTHER): Payer: No Typology Code available for payment source | Admitting: Family Medicine

## 2019-11-19 ENCOUNTER — Other Ambulatory Visit: Payer: Self-pay

## 2019-11-19 VITALS — BP 120/70 | HR 76 | Ht 65.75 in | Wt 152.2 lb

## 2019-11-19 DIAGNOSIS — F439 Reaction to severe stress, unspecified: Secondary | ICD-10-CM

## 2019-11-19 DIAGNOSIS — Z23 Encounter for immunization: Secondary | ICD-10-CM | POA: Diagnosis not present

## 2019-11-19 DIAGNOSIS — I10 Essential (primary) hypertension: Secondary | ICD-10-CM

## 2019-11-19 DIAGNOSIS — Z0000 Encounter for general adult medical examination without abnormal findings: Secondary | ICD-10-CM

## 2019-11-19 DIAGNOSIS — Z8349 Family history of other endocrine, nutritional and metabolic diseases: Secondary | ICD-10-CM | POA: Diagnosis not present

## 2019-11-19 DIAGNOSIS — M722 Plantar fascial fibromatosis: Secondary | ICD-10-CM

## 2019-11-19 DIAGNOSIS — L918 Other hypertrophic disorders of the skin: Secondary | ICD-10-CM

## 2019-11-19 DIAGNOSIS — Z8632 Personal history of gestational diabetes: Secondary | ICD-10-CM | POA: Diagnosis not present

## 2019-11-19 NOTE — Progress Notes (Signed)
Subjective:    Patient ID: Sharon Beck, female    DOB: 01-23-1975, 44 y.o.   MRN: [id number]  HPI Chief Complaint  Patient presents with   fasting cpe    fasting cpe, sees obgyn- [facility], flu shot   She is here for a complete physical exam.  Last CPE: 11/18/2018  HTN- has been on losartan 25 mg.  States her blood pressure at times and she has noted some elevated readings.  Her father in law passed early this year.  Reports being under a lot of stress.  Is interested in seeking counseling.  States she has a history of plantar fasciitis in her left foot.  She walks around barefoot in her home on hardwood floors frequently.  She would like to have a skin tag removed from her left axilla.   Other providers: Dr. Donne Hazel- surgeon [facility] - Dr. Arlana Pouch at [facility]    Social history: Lives with husband and 2 kids.  She is not currently working outside the home.  Denies smoking, drinking alcohol, drug use  Diet: healthy  Excerise: 3 days per week walking    Health maintenance:  Mammogram: August 2021  Colonoscopy: never  Last Gynecological Exam: August 2021  Last Dental Exam: every 6 months  Last Eye Exam: April 2021   Wears seatbelt always, uses sunscreen, smoke detectors in home and functioning, does not text while driving and feels safe in home environment.   Reviewed allergies, medications, past medical, surgical, family, and social history.     Review of Systems Review of Systems Constitutional: -fever, -chills, -sweats, -unexpected weight change,-fatigue ENT: -runny nose, -ear pain, -sore throat Cardiology:  -chest pain, -palpitations, -edema Respiratory: -cough, -shortness of breath, -wheezing Gastroenterology: -abdominal pain, -nausea, -vomiting, -diarrhea, -constipation  Hematology: -bleeding or bruising problems Musculoskeletal: -arthralgias, -myalgias, -joint swelling, -back  pain Ophthalmology: -vision changes Urology: -dysuria, -difficulty urinating, -hematuria, -urinary frequency, -urgency Neurology: -headache, -weakness, -tingling, -numbness       Objective:   Physical Exam BP 120/70    Pulse 76    Ht 5' 5.75" (1.67 m)    Wt 152 lb 3.2 oz (69 kg)    BMI 24.75 kg/m   General Appearance:    Alert, cooperative, no distress, appears stated age  Head:    Normocephalic, without obvious abnormality, atraumatic  Eyes:    PERRL, conjunctiva/corneas clear, EOM's intact  Ears:    Normal TM's and external ear canals  Nose:   Mask on   Throat:   Mask on  Neck:   Supple, no lymphadenopathy;  thyroid:  no   enlargement/tenderness/nodules; no JVD  Back:    Spine nontender, no curvature, ROM normal, no CVA     tenderness  Lungs:     Clear to auscultation bilaterally without wheezes, rales or     ronchi; respirations unlabored  Chest Wall:    No tenderness or deformity   Heart:    Regular rate and rhythm, S1 and S2 normal, no murmur, rub   or gallop  Breast Exam:    OB/GYN  Abdomen:     Soft, non-tender, nondistended, normoactive bowel sounds,    no masses, no hepatosplenomegaly  Genitalia:    OB/GYN     Extremities:   No clubbing, cyanosis or edema  Pulses:   2+ and symmetric all extremities  Skin:   Skin color, texture, turgor normal, no rashes or lesions. Skin tag in left axilla.   Lymph nodes:  Cervical, supraclavicular, and axillary nodes normal  Neurologic:   CNII-XII intact, normal strength, sensation and gait; reflexes 2+ and symmetric throughout          Psych:   Normal mood, affect, hygiene and grooming.         Assessment & Plan:  Routine general medical examination at a health care facility - Plan: CBC with Differential/Platelet, Comprehensive metabolic panel, TSH, T4, free, Lipid panel -She is here today for fasting CPE.  Preventive health care reviewed.  She sees her OB/GYN.  Counseling on healthy lifestyle including diet and exercise.   Immunizations reviewed.  Discussed safety and health promotion.  Primary hypertension -Continue losartan and keep a closer eye on her blood pressure.  History of gestational diabetes  Family history of thyroid disease in sister - Plan: TSH, T4, free  Plantar fasciitis of left foot -Counseling on conservative treatment for plantar fasciitis.  Strongly encouraged her to avoid walking barefoot or in flip-flops.  Discussed using ice massage, stretching and wearing a splint at night.  She may also try NSAIDs for the next few days.  She will let me know if this is not working and I will refer her to podiatrist.  Skin tag -The area was cleaned with alcohol.  Sterile tweezers and scissors used to remove the skin tag.  No bleeding  Stress -She will call and schedule with a therapist.  Needs flu shot - Plan: Flu Vaccine QUAD 36+ mos IM

## 2019-11-19 NOTE — Patient Instructions (Signed)

## 2019-11-20 ENCOUNTER — Encounter: Payer: Self-pay | Admitting: Family Medicine

## 2019-11-20 LAB — CBC WITH DIFFERENTIAL/PLATELET
Basophils Absolute: 0 10*3/uL (ref 0.0–0.2)
Basos: 1 %
EOS (ABSOLUTE): 0.1 10*3/uL (ref 0.0–0.4)
Eos: 2 %
Hematocrit: 41.3 % (ref 34.0–46.6)
Hemoglobin: 13.8 g/dL (ref 11.1–15.9)
Immature Grans (Abs): 0 10*3/uL (ref 0.0–0.1)
Immature Granulocytes: 0 %
Lymphocytes Absolute: 1.1 10*3/uL (ref 0.7–3.1)
Lymphs: 26 %
MCH: 31.4 pg (ref 26.6–33.0)
MCHC: 33.4 g/dL (ref 31.5–35.7)
MCV: 94 fL (ref 79–97)
Monocytes Absolute: 0.5 10*3/uL (ref 0.1–0.9)
Monocytes: 11 %
Neutrophils Absolute: 2.6 10*3/uL (ref 1.4–7.0)
Neutrophils: 60 %
Platelets: 227 10*3/uL (ref 150–450)
RBC: 4.39 x10E6/uL (ref 3.77–5.28)
RDW: 12 % (ref 11.7–15.4)
WBC: 4.3 10*3/uL (ref 3.4–10.8)

## 2019-11-20 LAB — COMPREHENSIVE METABOLIC PANEL
ALT: 56 IU/L — ABNORMAL HIGH (ref 0–32)
AST: 32 IU/L (ref 0–40)
Albumin/Globulin Ratio: 1.8 (ref 1.2–2.2)
Albumin: 4.6 g/dL (ref 3.8–4.8)
Alkaline Phosphatase: 87 IU/L (ref 44–121)
BUN/Creatinine Ratio: 15 (ref 9–23)
BUN: 10 mg/dL (ref 6–24)
Bilirubin Total: 0.3 mg/dL (ref 0.0–1.2)
CO2: 21 mmol/L (ref 20–29)
Calcium: 9.3 mg/dL (ref 8.7–10.2)
Chloride: 102 mmol/L (ref 96–106)
Creatinine, Ser: 0.68 mg/dL (ref 0.57–1.00)
GFR calc Af Amer: 125 mL/min/{1.73_m2} (ref >59)
GFR calc non Af Amer: 108 mL/min/{1.73_m2} (ref >59)
Globulin, Total: 2.6 g/dL (ref 1.5–4.5)
Glucose: 91 mg/dL (ref 65–99)
Potassium: 4.6 mmol/L (ref 3.5–5.2)
Sodium: 138 mmol/L (ref 134–144)
Total Protein: 7.2 g/dL (ref 6.0–8.5)

## 2019-11-20 LAB — LIPID PANEL
Chol/HDL Ratio: 3.7 ratio (ref 0.0–4.4)
Cholesterol, Total: 194 mg/dL (ref 100–199)
HDL: 53 mg/dL (ref >39)
LDL Chol Calc (NIH): 120 mg/dL — ABNORMAL HIGH (ref 0–99)
Triglycerides: 120 mg/dL (ref 0–149)
VLDL Cholesterol Cal: 21 mg/dL (ref 5–40)

## 2019-11-20 LAB — T4, FREE: Free T4: 1 ng/dL (ref 0.82–1.77)

## 2019-11-20 LAB — TSH: TSH: 1.84 u[IU]/mL (ref 0.450–4.500)

## 2019-11-20 NOTE — Progress Notes (Signed)
Please ask Verdis Frederickson to add an acute hepatitis panel due to elevated ALT.

## 2019-11-21 LAB — HEPATITIS PANEL, ACUTE
Hep A IgM: NEGATIVE
Hep B C IgM: NEGATIVE
Hep C Virus Ab: <0.1 s/co ratio (ref 0.0–0.9)
Hepatitis B Surface Ag: NEGATIVE

## 2019-11-21 LAB — SPECIMEN STATUS REPORT

## 2019-12-23 ENCOUNTER — Other Ambulatory Visit (INDEPENDENT_AMBULATORY_CARE_PROVIDER_SITE_OTHER): Payer: No Typology Code available for payment source

## 2019-12-23 ENCOUNTER — Other Ambulatory Visit: Payer: Self-pay

## 2019-12-23 DIAGNOSIS — Z23 Encounter for immunization: Secondary | ICD-10-CM | POA: Diagnosis not present

## 2019-12-23 DIAGNOSIS — R748 Abnormal levels of other serum enzymes: Secondary | ICD-10-CM

## 2019-12-23 LAB — COMPREHENSIVE METABOLIC PANEL
ALT: 11 IU/L (ref 0–32)
AST: 12 IU/L (ref 0–40)
Albumin/Globulin Ratio: 1.9 (ref 1.2–2.2)
Albumin: 4.4 g/dL (ref 3.8–4.8)
Alkaline Phosphatase: 54 IU/L (ref 44–121)
BUN/Creatinine Ratio: 16 (ref 9–23)
BUN: 12 mg/dL (ref 6–24)
Bilirubin Total: 0.5 mg/dL (ref 0.0–1.2)
CO2: 22 mmol/L (ref 20–29)
Calcium: 9.3 mg/dL (ref 8.7–10.2)
Chloride: 103 mmol/L (ref 96–106)
Creatinine, Ser: 0.76 mg/dL (ref 0.57–1.00)
GFR calc Af Amer: 112 mL/min/{1.73_m2} (ref >59)
GFR calc non Af Amer: 97 mL/min/{1.73_m2} (ref >59)
Globulin, Total: 2.3 g/dL (ref 1.5–4.5)
Glucose: 85 mg/dL (ref 65–99)
Potassium: 4.5 mmol/L (ref 3.5–5.2)
Sodium: 139 mmol/L (ref 134–144)
Total Protein: 6.7 g/dL (ref 6.0–8.5)

## 2020-01-05 ENCOUNTER — Other Ambulatory Visit: Payer: Self-pay | Admitting: Family Medicine

## 2020-01-05 ENCOUNTER — Telehealth: Payer: Self-pay | Admitting: Family Medicine

## 2020-01-05 DIAGNOSIS — I10 Essential (primary) hypertension: Secondary | ICD-10-CM

## 2020-01-05 MED FILL — LOSARTAN POTASSIUM 50 MG TA: 50 | 90 days supply | Qty: 45 | Fill #0

## 2020-01-05 NOTE — Telephone Encounter (Signed)
sent 

## 2020-01-05 NOTE — Telephone Encounter (Signed)
[facility] req Losartan Potassium 50 mg #45

## 2020-02-04 IMAGING — CR DG CHEST 2V
2 series · 2 of 2 positions shown · non-contrast
Comparison: None.

CLINICAL DATA: Intermittent chest pain

EXAM:
CHEST - 2 VIEW

[w chest pa]
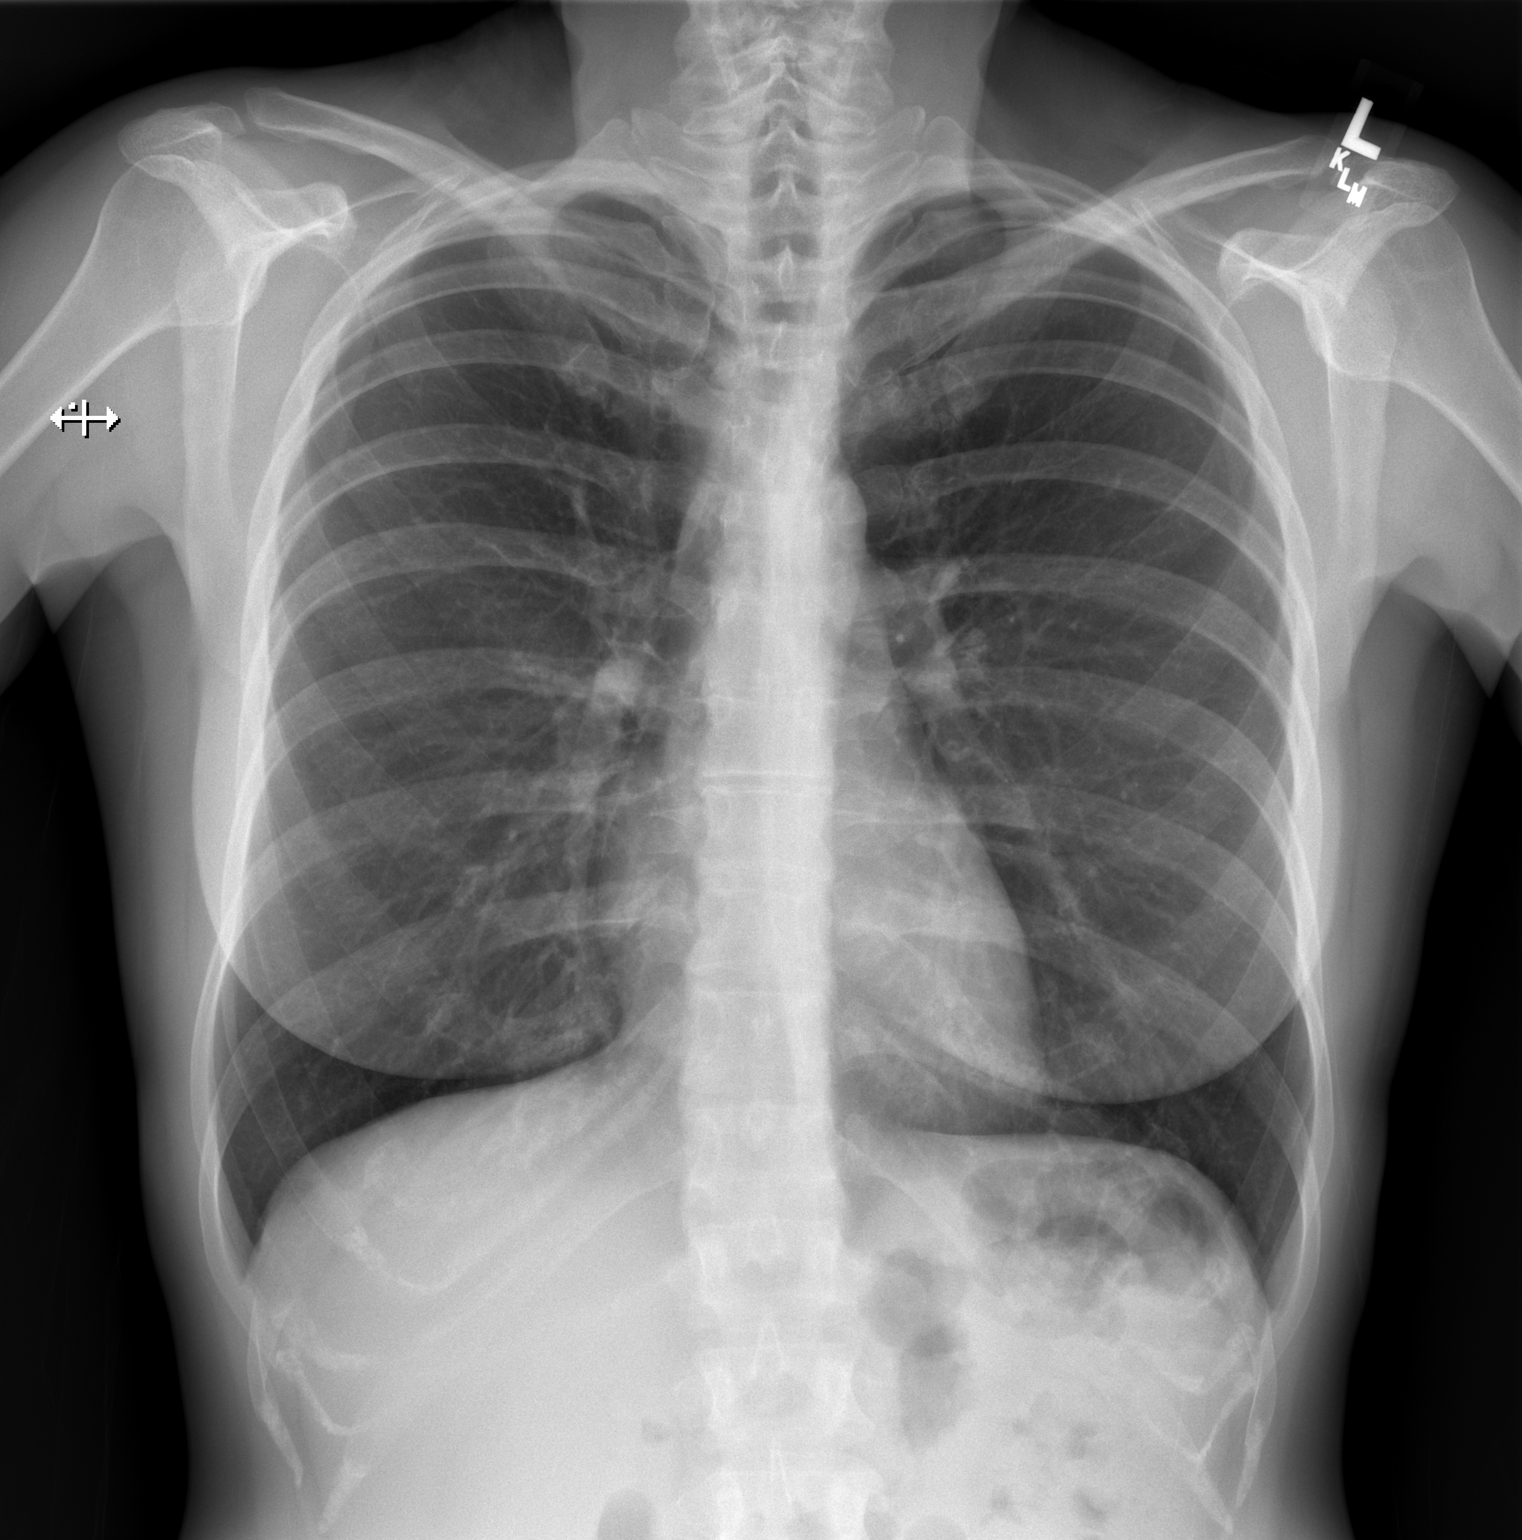

[w chest lat]
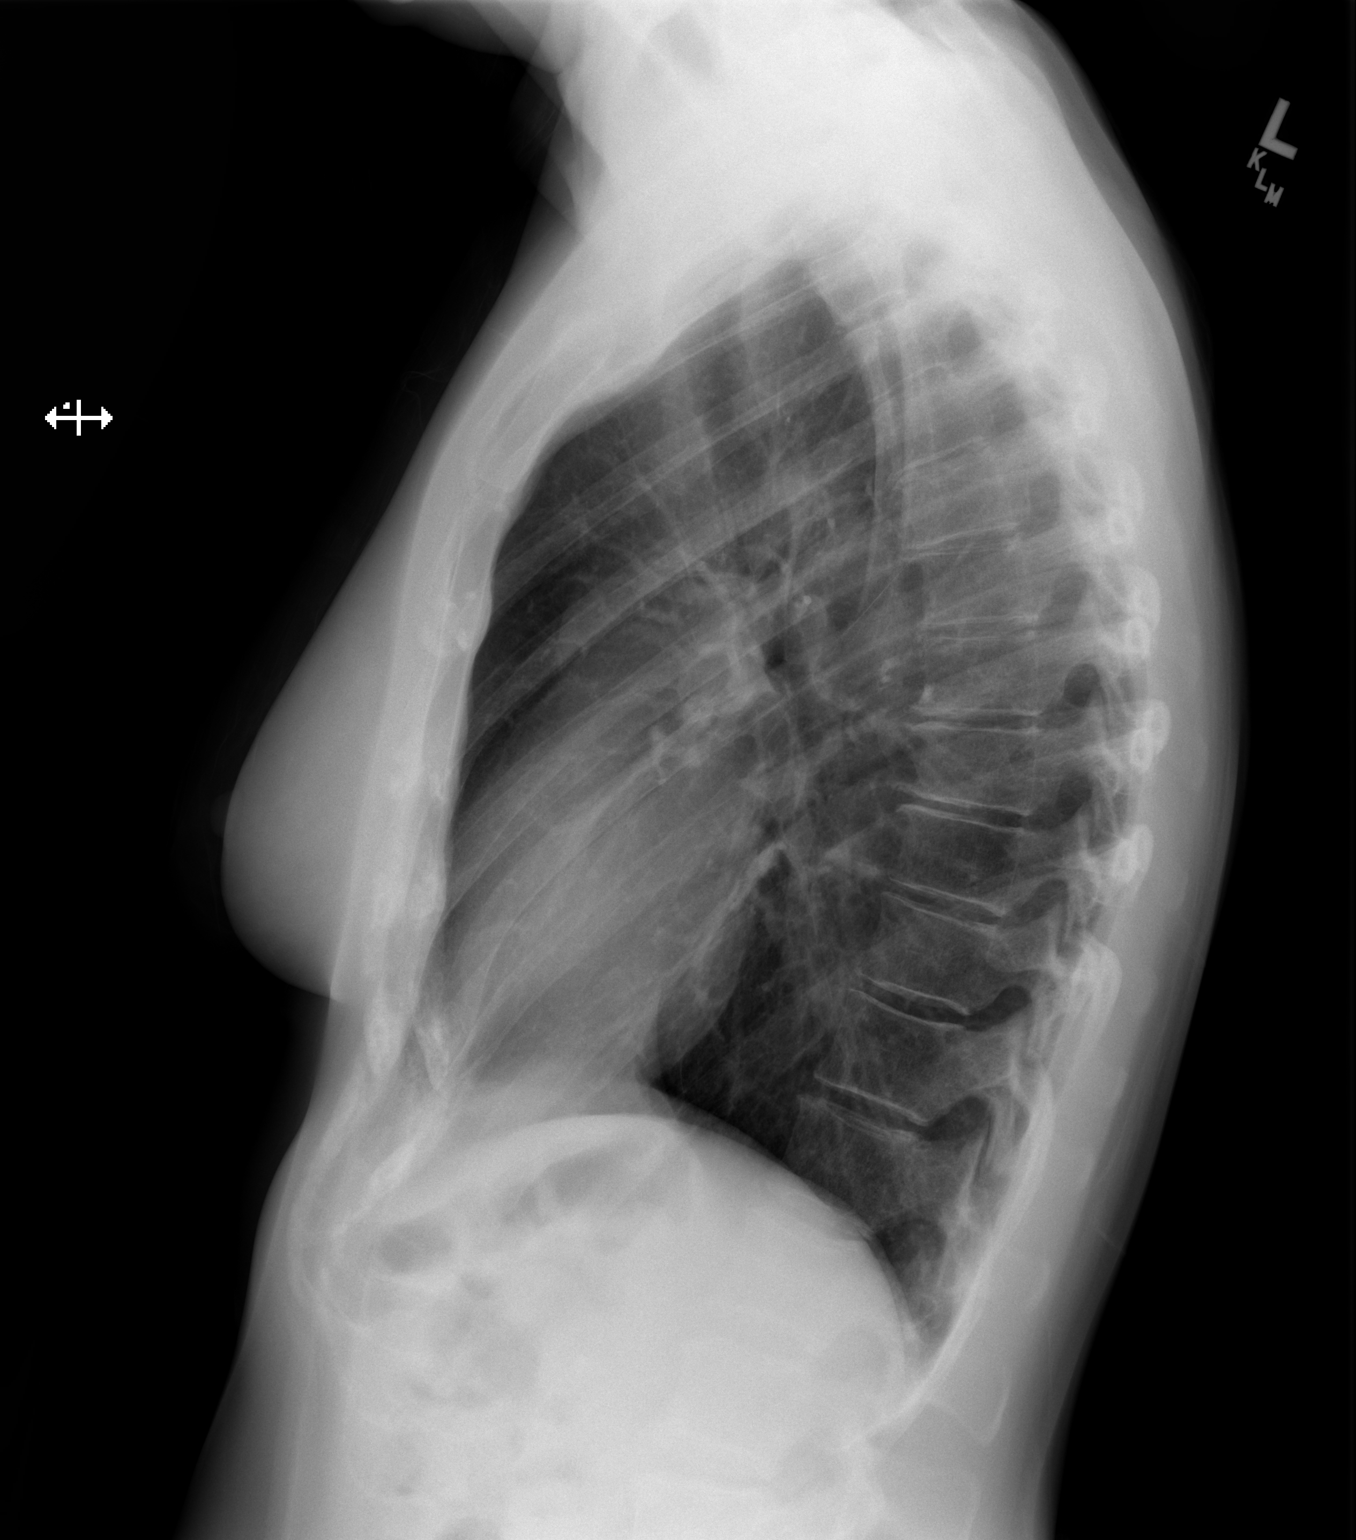

[2 of 2 positions shown; findings below may reference images not displayed]

FINDINGS: The heart size and mediastinal contours are within normal limits.
Both lungs are clear. The visualized skeletal structures are
unremarkable.
IMPRESSION: No active cardiopulmonary disease.

## 2020-04-07 ENCOUNTER — Other Ambulatory Visit (HOSPITAL_BASED_OUTPATIENT_CLINIC_OR_DEPARTMENT_OTHER): Payer: Self-pay

## 2020-04-14 MED FILL — LOSARTAN POTASSIUM 50 MG TA: 50 | 90 days supply | Qty: 45 | Fill #1

## 2020-05-18 LAB — HM DIABETES EYE EXAM

## 2020-07-13 ENCOUNTER — Other Ambulatory Visit: Payer: Self-pay | Admitting: Family Medicine

## 2020-07-13 ENCOUNTER — Other Ambulatory Visit (HOSPITAL_COMMUNITY): Payer: Self-pay

## 2020-07-13 DIAGNOSIS — I10 Essential (primary) hypertension: Secondary | ICD-10-CM

## 2020-07-13 MED ORDER — LOSARTAN POTASSIUM 50 MG PO TABS
ORAL_TABLET | Freq: Every day | ORAL | 4 refills | Status: DC
  Filled 2020-07-13: qty 30, 30d supply, fill #0
  Filled 2020-09-13: qty 30, 30d supply, fill #1
  Filled 2020-10-11: qty 30, 30d supply, fill #2
  Filled 2020-11-09: qty 30, 30d supply, fill #3
  Filled 2021-02-14: qty 30, 30d supply, fill #4

## 2020-08-25 ENCOUNTER — Encounter: Payer: Self-pay | Admitting: Internal Medicine

## 2020-09-03 ENCOUNTER — Encounter: Payer: Self-pay | Admitting: Internal Medicine

## 2020-09-13 ENCOUNTER — Other Ambulatory Visit (HOSPITAL_COMMUNITY): Payer: Self-pay

## 2020-10-01 ENCOUNTER — Other Ambulatory Visit (HOSPITAL_COMMUNITY): Payer: Self-pay

## 2020-10-01 MED ORDER — ALPRAZOLAM 0.5 MG PO TABS
ORAL_TABLET | ORAL | 0 refills | Status: DC
  Filled 2020-10-01: qty 30, 10d supply, fill #0

## 2020-10-07 LAB — HM PAP SMEAR: HPV, high-risk: NEGATIVE

## 2020-10-11 ENCOUNTER — Other Ambulatory Visit (HOSPITAL_COMMUNITY): Payer: Self-pay

## 2020-10-18 ENCOUNTER — Encounter: Payer: Self-pay | Admitting: Family Medicine

## 2020-10-21 NOTE — Progress Notes (Signed)
   Subjective:    Patient ID: Joseph Art, female    DOB: August 14, 1975, 45 y.o.   MRN: [id number]  HPI Chief Complaint  Patient presents with   Anxiety    Younger sister passed away a few weeks ago and is going through grief as well.    She is here to discuss recent unexpected passing of her sister and she is experiencing several different emotions with the grieving process. States her sister had a brain aneurysm rupture.   She saw her OB/GYN immediately after her sister's death and prior to the funeral and her OB/GYN did prescribe her alprazolam.  She is using this sparingly and appropriately.  States she also has a aunt who had a ruptured aneurysm.  She has not yet started counseling but is amenable to this.  States she will try bereavement counseling with hospice.  No other concerns today.    Review of Systems Pertinent positives and negatives in the history of present illness.     Objective:   Physical Exam BP 140/72 (BP Location: Right Arm, Patient Position: Sitting)   Pulse 86   Ht 5\' 4"  (1.626 m)   Wt 149 lb 12.8 oz (67.9 kg)   LMP 10/10/2020   SpO2 97%   BMI 25.71 kg/m   Alert and oriented in no acute distress.  Respirations unlabored.  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  She is experiencing significant grief and is working through the shock and sadness.  Strongly encouraged her to get counseling and she is planning to do this.  She has alprazolam that she can take as needed.  I will order a brain MRI due to sister and maternal aunt with brain aneurysm ruptures.

## 2020-10-22 ENCOUNTER — Encounter: Payer: Self-pay | Admitting: Family Medicine

## 2020-10-22 ENCOUNTER — Telehealth: Payer: Self-pay | Admitting: Internal Medicine

## 2020-10-22 ENCOUNTER — Ambulatory Visit (INDEPENDENT_AMBULATORY_CARE_PROVIDER_SITE_OTHER): Payer: No Typology Code available for payment source | Admitting: Family Medicine

## 2020-10-22 ENCOUNTER — Other Ambulatory Visit: Payer: Self-pay

## 2020-10-22 VITALS — BP 140/72 | HR 86 | Ht 64.0 in | Wt 149.8 lb

## 2020-10-22 DIAGNOSIS — Z8249 Family history of ischemic heart disease and other diseases of the circulatory system: Secondary | ICD-10-CM

## 2020-10-22 DIAGNOSIS — F4321 Adjustment disorder with depressed mood: Secondary | ICD-10-CM

## 2020-10-22 DIAGNOSIS — Z23 Encounter for immunization: Secondary | ICD-10-CM | POA: Diagnosis not present

## 2020-10-22 NOTE — Telephone Encounter (Signed)
Would you take this patient on as a patient? She is great and would like to stay with a female

## 2020-10-22 NOTE — Patient Instructions (Signed)
You can call to schedule your appointment with the counselor. A few offices are listed below for you to call.   Linzie Collin (female) [phone]    [facility] [address] [place], [place] [address]  Phone: [facility]  [address], [place], [place] [address]  Phone: [phone]  [facility] [address] [place], [place] [address] [phone] [phone] for appointments   [facility]  [address] [place], [place] [address] [place]  [place]  (across from [facility])  [phone]

## 2020-10-23 NOTE — Telephone Encounter (Signed)
That's fine.  She is due for CPE in 11/2020 (had been scheduled for one with Vickie that month)--I have an opening for an afternoon CPE on 11/24/20.  See if she can come then.

## 2020-10-25 NOTE — Telephone Encounter (Signed)
Pt took that appointment and I have changed PCP to you

## 2020-11-09 ENCOUNTER — Other Ambulatory Visit (HOSPITAL_COMMUNITY): Payer: Self-pay

## 2020-11-12 ENCOUNTER — Ambulatory Visit
Admission: RE | Admit: 2020-11-12 | Discharge: 2020-11-12 | Disposition: A | Discharge status: Home / Self Care | Payer: No Typology Code available for payment source | Source: Ambulatory Visit | Attending: Family Medicine | Admitting: Family Medicine

## 2020-11-12 DIAGNOSIS — Z8249 Family history of ischemic heart disease and other diseases of the circulatory system: Secondary | ICD-10-CM

## 2020-11-12 MED ORDER — GADOBENATE DIMEGLUMINE 529 MG/ML IV SOLN
14.0000 mL | Freq: Once | INTRAVENOUS | Status: AC | PRN
  Administered 2020-11-12: 14 mL via INTRAVENOUS

## 2020-11-19 ENCOUNTER — Encounter: Payer: No Typology Code available for payment source | Admitting: Family Medicine

## 2020-11-23 ENCOUNTER — Encounter: Payer: Self-pay | Admitting: Family Medicine

## 2020-11-23 NOTE — Patient Instructions (Addendum)
  HEALTH MAINTENANCE RECOMMENDATIONS:  It is recommended that you get at least 30 minutes of aerobic exercise at least 5 days/week (for weight loss, you may need as much as 60-90 minutes). This can be any activity that gets your heart rate up. This can be divided in 10-15 minute intervals if needed, but try and build up your endurance at least once a week.  Weight bearing exercise is also recommended twice weekly.  Eat a healthy diet with lots of vegetables, fruits and fiber.  "Colorful" foods have a lot of vitamins (ie green vegetables, tomatoes, red peppers, etc).  Limit sweet tea, regular sodas and alcoholic beverages, all of which has a lot of calories and sugar.  Up to 1 alcoholic drink daily may be beneficial for women (unless trying to lose weight, watch sugars).  Drink a lot of water.  Calcium recommendations are 1200-1500 mg daily (1500 mg for postmenopausal women or women without ovaries), and vitamin D 1000 IU daily.  This should be obtained from diet and/or supplements (vitamins), and calcium should not be taken all at once, but in divided doses.  Monthly self breast exams and yearly mammograms for women over the age of 9 is recommended.  Sunscreen of at least SPF 30 should be used on all sun-exposed parts of the skin when outside between the hours of 10 am and 4 pm (not just when at beach or pool, but even with exercise, golf, tennis, and yard work!)  Use a sunscreen that says "broad spectrum" so it covers both UVA and UVB rays, and make sure to reapply every 1-2 hours.  Remember to change the batteries in your smoke detectors when changing your clock times in the spring and fall. Carbon monoxide detectors are recommended for your home.  Use your seat belt every time you are in a car, and please drive safely and not be distracted with cell phones and texting while driving.  Please monitor your blood pressure more regularly.  Goal is <130/80.  Please try and limit the sodium in your diet  and exercise regularly.  If blood pressure is consistently higher, let us know and we can increase the losartan dose to 50mg .

## 2020-11-23 NOTE — Progress Notes (Signed)
Chief Complaint  Patient presents with   Annual Exam    Nonfasting annual exam. Sees eye doctor regularly. Xanax originally rx'd by Dr. Pamala Hurry, she wanted to know if she Sharon Beck needed a refill-would you feel comfortable doing this?   Sharon Beck is a 45 y.o. female who presents for a complete physical.   She has the following concerns:  To discuss recent MRI.  Asking about getting alprazolam refills from this office in the future. Has 17.5 tablets left of #30 from 9/16 from GYN. Takes 1/2 tablet 2-3x/week for uncontrollable crying (related to grief from recent loss of her sister).  Hypertension:  Blood pressures at home are 125-132/75-86.  She hasn't checked BP since her sister died. Admits to feeling stressed, anxious, grieving.  Denies dizziness, headaches, chest pain.  Denies side effects of medications. She reports compliance with taking losartan 25mg  daily, on meds for a few years.  BP Readings from Last 3 Encounters:  11/24/20 (!) 142/90  10/22/20 140/72  11/19/19 120/70   Family history of cerebral aneurysms.  Her sister passed recently due to ruptured aneurysm (at age 59), and reportedly has an aunt who had the same (in her late 13's).  For that reason, screening was recommended by Vickie. She had MRI ordered (under my name so that I would get results after she left) in 10/2020.  Unfortunately, the proper test to rule out aneurysm would have been an MRA.  The MRI is normal, as reported below. IMPRESSION: 1. No acute intracranial abnormality. 2. Age advanced cerebral white matter T2 signal changes, nonspecific though may reflect early chronic small vessel ischemia, migraines, prior infection/inflammation, vasculitis, or demyelinating disease. 3. Note that a head MRA would be necessary to evaluate the intracranial arteries in detail, including screening for an aneurysm.   Immunization History  Administered Date(s) Administered   Influenza,inj,Quad PF,6+ Mos 11/19/2019    Influenza-Unspecified 10/05/2015, 09/30/2016, 09/20/2017, 10/17/2018, 11/05/2020   PFIZER(Purple Top)SARS-COV-2 Vaccination 01/28/2019, 02/18/2019, 12/23/2019   Pfizer Covid-19 Vaccine Bivalent Booster 65yrs & up 10/22/2020   Tdap 03/12/2014   Last Pap smear: 01/2018; last GYN visit was 09/2020 Last mammogram: 06/2018 is last in chart. Had mammo through Dr. Earl Many office 09/2020 Last colonoscopy: never Dentist: every 6 months Ophtho: yearly Exercise: walks the dog (not aerobic). Active with kids, hiking.   Vitamin D-OH 33 in 09/2015. Recalls having low vitamin D in the past, rx treatment. Lipid screen: Lab Results  Component Value Date   CHOL 194 11/19/2019   HDL 53 11/19/2019   LDLCALC 120 (H) 11/19/2019   TRIG 120 11/19/2019   CHOLHDL 3.7 11/19/2019   Thyroid screen: Lab Results  Component Value Date   TSH 1.840 11/19/2019   Other providers: Dr. Donne Hazel- [facility] - Dr. Arlana Pouch at [facility]  [facility]    PMH, [facility], [facility] and [facility] were reviewed and updated. H/o GDM, HTN FH thyroid disease, cerebral aneurysms  Outpatient Encounter Medications as of 11/24/2020  Medication Sig Note   ALPRAZolam (XANAX) 0.5 MG tablet Take 1 tablet by mouth 3 times a day. 11/24/2020: Uses maybe 2-3 times per week;1/2 tablet   losartan (COZAAR) 50 MG tablet Take 1 tablet by mouth daily. (Patient taking differently: Take 25 mg by mouth daily. TAKE 1/2 TABLET DAILY)    No facility-administered encounter medications on file as of 11/24/2020.   Allergies  Allergen Reactions   Aspirin Rash    Pt tolerates ibuprofen    ROS:  The patient  denies anorexia, fever, weight changes, headaches,  vision changes, decreased hearing, ear pain, sore throat, breast concerns, chest pain, palpitations, dizziness, syncope, dyspnea on exertion, cough, swelling, nausea, vomiting, diarrhea, constipation, abdominal pain, melena, hematochezia, indigestion/heartburn, hematuria,  incontinence, dysuria, irregular menstrual cycles, vaginal discharge, odor or itch, genital lesions, joint pains, numbness, tingling, weakness, tremor, suspicious skin lesions, abnormal bleeding/bruising, or enlarged lymph nodes. Occasional skipped beat, notices at night only. Grief, occasional uncontrollable crying.   PHYSICAL EXAM:  BP (!) 142/90   Pulse 76   Ht 5\' 5"  (1.651 m)   Wt 150 lb 3.2 oz (68.1 kg)   LMP 11/09/2020 (Approximate)   BMI 24.99 kg/m  154/94 on repeat 140/90 when repeated again  Wt Readings from Last 3 Encounters:  11/24/20 150 lb 3.2 oz (68.1 kg)  10/22/20 149 lb 12.8 oz (67.9 kg)  11/19/19 152 lb 3.2 oz (69 kg)   General Appearance:    Alert, cooperative, no distress, appears stated age  Head:    Normocephalic, without obvious abnormality, atraumatic  Eyes:    PERRL, conjunctiva/corneas clear, EOM's intact  Ears:    Normal TM's and external ear canals  Nose:   Not examined, wearing mask due to COVID pandemic  Throat:   Not examined, wearing mask due to COVID pandemic  Neck:   Supple, no lymphadenopathy;  thyroid:  no enlargement/ tenderness/nodules; no JVD  Back:    Spine nontender, no curvature, ROM normal, no CVA     tenderness  Lungs:     Clear to auscultation bilaterally without wheezes, rales or     ronchi; respirations unlabored  Chest Wall:    No tenderness or deformity   Heart:    Regular rate and rhythm, S1 and S2 normal, no murmur, rub   or gallop  Breast Exam:    Deferred to OB/GYN  Abdomen:     Soft, non-tender, nondistended, normoactive bowel sounds,    no masses, no hepatosplenomegaly  Genitalia:    Deferred to OB/GYN       Extremities:   No clubbing, cyanosis or edema  Pulses:   2+ and symmetric all extremities  Skin:   Skin color, texture, turgor normal, no rashes or lesions.   Lymph nodes:   Cervical, supraclavicular nodes normal  Neurologic:   Normal strength, sensation and gait; reflexes 2+ and symmetric throughout                                Psych:   Normal mood, affect, hygiene and grooming.   ASSESSMENT/PLAN:  Annual physical exam - Plan: POCT Urinalysis DIP (Proadvantage Device), Comprehensive metabolic panel, Lipid panel, VITAMIN D 25 Hydroxy (Vit-D Deficiency, Fractures), CBC with Differential/Platelet  History of gestational diabetes - Plan: Comprehensive metabolic panel  Family history of colon cancer in father - start screening at age 35  History of vitamin D deficiency - Plan: VITAMIN D 25 Hydroxy (Vit-D Deficiency, Fractures)  Family history of brain aneurysm - order MRA to screen - Plan: MR Angiogram Head Wo Contrast  Essential hypertension - BP elevated today. To  monitor regularly at home, and increase to 50mg  losartan if remains >130/80. Low Na diet, exercise - Plan: Comprehensive metabolic panel, Lipid panel  Pt to send in list of BP's within the month.  Alprazolam sparingly.  Grief counseling discussed. Will refill if/when needed.  Discussed monthly self breast exams and yearly mammograms (will get report from GYN); at least 30  minutes of aerobic activity at least 5 days/week, weight-bearing exercise at least 2x/week; proper sunscreen use reviewed; healthy diet, including goals of calcium and vitamin D intake and alcohol recommendations (less than or equal to 1 drink/day) reviewed; regular seatbelt use; changing batteries in smoke detectors.  Immunization recommendations discussed--continue yearly flu shots.  Colonoscopy recommendations reviewed, age 56.

## 2020-11-24 ENCOUNTER — Other Ambulatory Visit: Payer: Self-pay

## 2020-11-24 ENCOUNTER — Encounter: Payer: Self-pay | Admitting: *Deleted

## 2020-11-24 ENCOUNTER — Ambulatory Visit (INDEPENDENT_AMBULATORY_CARE_PROVIDER_SITE_OTHER): Payer: Managed Care, Other (non HMO) | Admitting: Family Medicine

## 2020-11-24 ENCOUNTER — Encounter: Payer: Self-pay | Admitting: Family Medicine

## 2020-11-24 VITALS — BP 140/90 | HR 76 | Ht 65.0 in | Wt 150.2 lb

## 2020-11-24 DIAGNOSIS — Z0000 Encounter for general adult medical examination without abnormal findings: Secondary | ICD-10-CM | POA: Diagnosis not present

## 2020-11-24 DIAGNOSIS — Z8639 Personal history of other endocrine, nutritional and metabolic disease: Secondary | ICD-10-CM | POA: Diagnosis not present

## 2020-11-24 DIAGNOSIS — Z800 Family history of malignant neoplasm of digestive organs: Secondary | ICD-10-CM

## 2020-11-24 DIAGNOSIS — Z8632 Personal history of gestational diabetes: Secondary | ICD-10-CM

## 2020-11-24 DIAGNOSIS — I10 Essential (primary) hypertension: Secondary | ICD-10-CM

## 2020-11-24 DIAGNOSIS — Z8249 Family history of ischemic heart disease and other diseases of the circulatory system: Secondary | ICD-10-CM

## 2020-11-24 LAB — POCT URINALYSIS DIP (PROADVANTAGE DEVICE)
Bilirubin, UA: NEGATIVE
Blood, UA: NEGATIVE
Glucose, UA: NEGATIVE mg/dL
Ketones, POC UA: NEGATIVE mg/dL
Leukocytes, UA: NEGATIVE
Nitrite, UA: NEGATIVE
Protein Ur, POC: NEGATIVE mg/dL
Specific Gravity, Urine: 1.02
Urobilinogen, Ur: NEGATIVE
pH, UA: 6 (ref 5.0–8.0)

## 2020-11-25 ENCOUNTER — Other Ambulatory Visit: Payer: Managed Care, Other (non HMO)

## 2020-11-25 ENCOUNTER — Encounter: Payer: Self-pay | Admitting: Family Medicine

## 2020-11-25 DIAGNOSIS — Z8639 Personal history of other endocrine, nutritional and metabolic disease: Secondary | ICD-10-CM

## 2020-11-25 DIAGNOSIS — Z8632 Personal history of gestational diabetes: Secondary | ICD-10-CM

## 2020-11-25 DIAGNOSIS — I10 Essential (primary) hypertension: Secondary | ICD-10-CM

## 2020-11-25 DIAGNOSIS — Z0000 Encounter for general adult medical examination without abnormal findings: Secondary | ICD-10-CM

## 2020-11-26 LAB — COMPREHENSIVE METABOLIC PANEL
ALT: 13 IU/L (ref 0–32)
AST: 16 IU/L (ref 0–40)
Albumin/Globulin Ratio: 2.5 — ABNORMAL HIGH (ref 1.2–2.2)
Albumin: 5.2 g/dL — ABNORMAL HIGH (ref 3.8–4.8)
Alkaline Phosphatase: 58 IU/L (ref 44–121)
BUN/Creatinine Ratio: 14 (ref 9–23)
BUN: 10 mg/dL (ref 6–24)
Bilirubin Total: 0.5 mg/dL (ref 0.0–1.2)
CO2: 19 mmol/L — ABNORMAL LOW (ref 20–29)
Calcium: 9.2 mg/dL (ref 8.7–10.2)
Chloride: 100 mmol/L (ref 96–106)
Creatinine, Ser: 0.74 mg/dL (ref 0.57–1.00)
Globulin, Total: 2.1 g/dL (ref 1.5–4.5)
Glucose: 92 mg/dL (ref 70–99)
Potassium: 4.4 mmol/L (ref 3.5–5.2)
Sodium: 138 mmol/L (ref 134–144)
Total Protein: 7.3 g/dL (ref 6.0–8.5)
eGFR: 103 mL/min/{1.73_m2} (ref >59)

## 2020-11-26 LAB — CBC WITH DIFFERENTIAL/PLATELET
Basophils Absolute: 0.1 10*3/uL (ref 0.0–0.2)
Basos: 1 %
EOS (ABSOLUTE): 0.2 10*3/uL (ref 0.0–0.4)
Eos: 5 %
Hematocrit: 42.1 % (ref 34.0–46.6)
Hemoglobin: 14.6 g/dL (ref 11.1–15.9)
Immature Grans (Abs): 0 10*3/uL (ref 0.0–0.1)
Immature Granulocytes: 0 %
Lymphocytes Absolute: 1.7 10*3/uL (ref 0.7–3.1)
Lymphs: 35 %
MCH: 31.8 pg (ref 26.6–33.0)
MCHC: 34.7 g/dL (ref 31.5–35.7)
MCV: 92 fL (ref 79–97)
Monocytes Absolute: 0.3 10*3/uL (ref 0.1–0.9)
Monocytes: 7 %
Neutrophils Absolute: 2.6 10*3/uL (ref 1.4–7.0)
Neutrophils: 52 %
Platelets: 255 10*3/uL (ref 150–450)
RBC: 4.59 x10E6/uL (ref 3.77–5.28)
RDW: 11.5 % — ABNORMAL LOW (ref 11.7–15.4)
WBC: 4.9 10*3/uL (ref 3.4–10.8)

## 2020-11-26 LAB — LIPID PANEL
Chol/HDL Ratio: 3.3 ratio (ref 0.0–4.4)
Cholesterol, Total: 197 mg/dL (ref 100–199)
HDL: 60 mg/dL (ref >39)
LDL Chol Calc (NIH): 116 mg/dL — ABNORMAL HIGH (ref 0–99)
Triglycerides: 121 mg/dL (ref 0–149)
VLDL Cholesterol Cal: 21 mg/dL (ref 5–40)

## 2020-11-26 LAB — VITAMIN D 25 HYDROXY (VIT D DEFICIENCY, FRACTURES): Vit D, 25-Hydroxy: 31.7 ng/mL (ref 30.0–100.0)

## 2020-12-01 ENCOUNTER — Encounter: Payer: Self-pay | Admitting: Family Medicine

## 2020-12-06 ENCOUNTER — Encounter: Payer: Self-pay | Admitting: Family Medicine

## 2020-12-14 ENCOUNTER — Ambulatory Visit
Admission: RE | Admit: 2020-12-14 | Discharge: 2020-12-14 | Disposition: A | Discharge status: Home / Self Care | Payer: No Typology Code available for payment source | Source: Ambulatory Visit | Attending: Family Medicine | Admitting: Family Medicine

## 2020-12-14 ENCOUNTER — Other Ambulatory Visit: Payer: Self-pay

## 2020-12-14 DIAGNOSIS — Z8249 Family history of ischemic heart disease and other diseases of the circulatory system: Secondary | ICD-10-CM

## 2021-02-14 ENCOUNTER — Other Ambulatory Visit (HOSPITAL_COMMUNITY): Payer: Self-pay

## 2021-02-15 ENCOUNTER — Other Ambulatory Visit (HOSPITAL_COMMUNITY): Payer: Self-pay

## 2021-02-15 MED ORDER — ALPRAZOLAM 0.5 MG PO TABS
0.5000 mg | ORAL_TABLET | Freq: Three times a day (TID) | ORAL | 0 refills | Status: DC
  Filled 2021-02-15: qty 30, 10d supply, fill #0

## 2021-04-06 ENCOUNTER — Other Ambulatory Visit (HOSPITAL_COMMUNITY): Payer: Self-pay

## 2021-04-06 ENCOUNTER — Other Ambulatory Visit: Payer: Self-pay | Admitting: Family Medicine

## 2021-04-06 DIAGNOSIS — I10 Essential (primary) hypertension: Secondary | ICD-10-CM

## 2021-04-06 MED ORDER — LOSARTAN POTASSIUM 50 MG PO TABS
50.0000 mg | ORAL_TABLET | Freq: Every day | ORAL | 5 refills | Status: DC
  Filled 2021-04-06: qty 30, 30d supply, fill #0
  Filled 2021-04-13: qty 90, 90d supply, fill #0
  Filled 2021-04-16: qty 30, 30d supply, fill #0
  Filled 2021-06-16: qty 30, 30d supply, fill #1
  Filled 2021-08-13: qty 30, 30d supply, fill #2
  Filled 2021-09-29: qty 30, 30d supply, fill #3

## 2021-04-13 ENCOUNTER — Other Ambulatory Visit (HOSPITAL_COMMUNITY): Payer: Self-pay

## 2021-04-16 ENCOUNTER — Other Ambulatory Visit (HOSPITAL_COMMUNITY): Payer: Self-pay

## 2021-04-18 ENCOUNTER — Other Ambulatory Visit (HOSPITAL_COMMUNITY): Payer: Self-pay

## 2021-06-16 ENCOUNTER — Other Ambulatory Visit (HOSPITAL_COMMUNITY): Payer: Self-pay

## 2021-06-21 ENCOUNTER — Other Ambulatory Visit (HOSPITAL_COMMUNITY): Payer: Self-pay

## 2021-08-13 ENCOUNTER — Other Ambulatory Visit (HOSPITAL_COMMUNITY): Payer: Self-pay

## 2021-08-16 ENCOUNTER — Other Ambulatory Visit (HOSPITAL_COMMUNITY): Payer: Self-pay

## 2021-09-21 ENCOUNTER — Encounter: Payer: Self-pay | Admitting: Internal Medicine

## 2021-09-29 ENCOUNTER — Other Ambulatory Visit (HOSPITAL_COMMUNITY): Payer: Self-pay

## 2021-10-25 ENCOUNTER — Encounter: Payer: Self-pay | Admitting: Internal Medicine

## 2021-10-28 DIAGNOSIS — Z23 Encounter for immunization: Secondary | ICD-10-CM | POA: Diagnosis not present

## 2021-12-02 NOTE — Patient Instructions (Incomplete)
  HEALTH MAINTENANCE RECOMMENDATIONS:  It is recommended that you get at least 30 minutes of aerobic exercise at least 5 days/week (for weight loss, you may need as much as 60-90 minutes). This can be any activity that gets your heart rate up. This can be divided in 10-15 minute intervals if needed, but try and build up your endurance at least once a week.  Weight bearing exercise is also recommended twice weekly.  Eat a healthy diet with lots of vegetables, fruits and fiber.  "Colorful" foods have a lot of vitamins (ie green vegetables, tomatoes, red peppers, etc).  Limit sweet tea, regular sodas and alcoholic beverages, all of which has a lot of calories and sugar.  Up to 1 alcoholic drink daily may be beneficial for women (unless trying to lose weight, watch sugars).  Drink a lot of water.  Calcium recommendations are 1200-1500 mg daily (1500 mg for postmenopausal women or women without ovaries), and vitamin D 1000 IU daily.  This should be obtained from diet and/or supplements (vitamins), and calcium should not be taken all at once, but in divided doses.  Monthly self breast exams and yearly mammograms for women over the age of 31 is recommended.  Sunscreen of at least SPF 30 should be used on all sun-exposed parts of the skin when outside between the hours of 10 am and 4 pm (not just when at beach or pool, but even with exercise, golf, tennis, and yard work!)  Use a sunscreen that says "broad spectrum" so it covers both UVA and UVB rays, and make sure to reapply every 1-2 hours.  Remember to change the batteries in your smoke detectors when changing your clock times in the spring and fall. Carbon monoxide detectors are recommended for your home.  Use your seat belt every time you are in a car, and please drive safely and not be distracted with cell phones and texting while driving.  I recommend taking 1000 IU of D3 through March (your level was at the lower end of normal last November, prior to  winter).   Try and limit the snacking--get small bags for portion control, and/or have grapes or other healthy snacks.   Be sure to get enough sleep. It is fine to use the alprazolam as you have been, which is very infrequent. Feel free to return and discuss other options if you have increased frequency or severity of anxiety.

## 2021-12-02 NOTE — Progress Notes (Signed)
No chief complaint on file.  Sharon Beck is a 46 y.o. female who presents for a complete physical.   She has the following concerns:  Hypertension: Denies dizziness, headaches, chest pain.  Denies side effects of medications. She reports compliance with taking losartan 66m daily. BP's are running  BP Readings from Last 3 Encounters:  11/24/20 140/90  10/22/20 140/72  11/19/19 120/70   Family history of cerebral aneurysms.  Her sister passed away last year due to ruptured aneurysm (at age 44, and reportedly has an aunt who had the same (in her late 554's.  She had normal MRI/MRA last year.  Immunization History  Administered Date(s) Administered   Influenza,inj,Quad PF,6+ Mos 11/19/2019   Influenza-Unspecified 10/05/2015, 09/30/2016, 09/20/2017, 10/17/2018, 11/05/2020   PFIZER(Purple Top)SARS-COV-2 Vaccination 01/28/2019, 02/18/2019, 12/23/2019   Pfizer Covid-19 Vaccine Bivalent Booster 180yr& up 10/22/2020   Tdap 03/12/2014   Last Pap smear: 01/2018; last GYN visit was 09/2020 UPDATE Last mammogram: 06/2018 is last in chart. Had mammo through Dr. FoEarl Manyffice 09/2020 UPDATE Last colonoscopy: never Dentist: every 6 months Ophtho: yearly Exercise: walks the dog (not aerobic). Active with kids, hiking.   Vitamin D-OH 31.7 11/2020 Lipid screen: Lab Results  Component Value Date   CHOL 197 11/25/2020   HDL 60 11/25/2020   LDLCALC 116 (H) 11/25/2020   TRIG 121 11/25/2020   CHOLHDL 3.3 11/25/2020   Thyroid screen: Lab Results  Component Value Date   TSH 1.840 11/19/2019   Other providers: Dr. WaDonne Hazelsurgeon [facility] - Dr. Fo[facility]lana Poucht [facility]  [facility]    [facility] reviewed and updated. H/o GDM, HTN FH thyroid disease, cerebral aneurysms     ROS:  The patient denies anorexia, fever, weight changes, headaches,  vision changes, decreased hearing, ear pain, sore throat, breast concerns, chest pain,  palpitations, dizziness, syncope, dyspnea on exertion, cough, swelling, nausea, vomiting, diarrhea, constipation, abdominal pain, melena, hematochezia, indigestion/heartburn, hematuria, incontinence, dysuria, irregular menstrual cycles, vaginal discharge, odor or itch, genital lesions, joint pains, numbness, tingling, weakness, tremor, suspicious skin lesions, abnormal bleeding/bruising, or enlarged lymph nodes. Occasional skipped beat, notices at night only.   PHYSICAL EXAM:  There were no vitals taken for this visit.  Wt Readings from Last 3 Encounters:  11/24/20 150 lb 3.2 oz (68.1 kg)  10/22/20 149 lb 12.8 oz (67.9 kg)  11/19/19 152 lb 3.2 oz (69 kg)   General Appearance:    Alert, cooperative, no distress, appears stated age  Head:    Normocephalic, without obvious abnormality, atraumatic  Eyes:    PERRL, conjunctiva/corneas clear, EOM's intact  Ears:    Normal TM's and external ear canals  Nose:   No drainage or sinus tenderness  Throat:   Normal mucosa  Neck:   Supple, no lymphadenopathy;  thyroid:  no enlargement/ tenderness/nodules; no JVD  Back:    Spine nontender, no curvature, ROM normal, no CVA     tenderness  Lungs:     Clear to auscultation bilaterally without wheezes, rales or     ronchi; respirations unlabored  Chest Wall:    No tenderness or deformity   Heart:    Regular rate and rhythm, S1 and S2 normal, no murmur, rub   or gallop  Breast Exam:    Deferred to OB/GYN  Abdomen:     Soft, non-tender, nondistended, normoactive bowel sounds,    no masses, no hepatosplenomegaly  Genitalia:    Deferred to OB/GYN  Extremities:   No clubbing, cyanosis or edema  Pulses:   2+ and symmetric all extremities  Skin:   Skin color, texture, turgor normal, no rashes or lesions.   Lymph nodes:   Cervical, supraclavicular nodes normal  Neurologic:   Normal strength, sensation and gait; reflexes 2+ and symmetric throughout                               Psych:   Normal mood,  affect, hygiene and grooming.   ASSESSMENT/PLAN:  Flu, COVID booster  Did she see GYN this year?  Need to get mammograms and paps and notes from GYN (last mammo in chart was 2021, never got last year's). [place]  Rec D through the winter  C-met  Discussed monthly self breast exams and yearly mammograms (will get report from GYN); at least 30 minutes of aerobic activity at least 5 days/week, weight-bearing exercise at least 2x/week; proper sunscreen use reviewed; healthy diet, including goals of calcium and vitamin D intake and alcohol recommendations (less than or equal to 1 drink/day) reviewed; regular seatbelt use; changing batteries in smoke detectors.  Immunization recommendations discussed--continue yearly flu shots.  Colonoscopy recommendations reviewed, age 40.

## 2021-12-05 ENCOUNTER — Encounter: Payer: Self-pay | Admitting: Family Medicine

## 2021-12-05 ENCOUNTER — Other Ambulatory Visit (HOSPITAL_COMMUNITY): Payer: Self-pay

## 2021-12-05 ENCOUNTER — Ambulatory Visit (INDEPENDENT_AMBULATORY_CARE_PROVIDER_SITE_OTHER): Payer: BC Managed Care – PPO | Admitting: Family Medicine

## 2021-12-05 VITALS — BP 110/72 | HR 68 | Ht 65.0 in | Wt 153.8 lb

## 2021-12-05 DIAGNOSIS — Z1211 Encounter for screening for malignant neoplasm of colon: Secondary | ICD-10-CM

## 2021-12-05 DIAGNOSIS — Z800 Family history of malignant neoplasm of digestive organs: Secondary | ICD-10-CM | POA: Diagnosis not present

## 2021-12-05 DIAGNOSIS — I10 Essential (primary) hypertension: Secondary | ICD-10-CM

## 2021-12-05 DIAGNOSIS — Z0000 Encounter for general adult medical examination without abnormal findings: Secondary | ICD-10-CM

## 2021-12-05 LAB — POCT URINALYSIS DIP (PROADVANTAGE DEVICE)
Bilirubin, UA: NEGATIVE
Blood, UA: NEGATIVE
Glucose, UA: NEGATIVE mg/dL
Ketones, POC UA: NEGATIVE mg/dL
Leukocytes, UA: NEGATIVE
Nitrite, UA: NEGATIVE
Protein Ur, POC: NEGATIVE mg/dL
Specific Gravity, Urine: 1.01
Urobilinogen, Ur: 0.2
pH, UA: 6.5 (ref 5.0–8.0)

## 2021-12-05 MED ORDER — LOSARTAN POTASSIUM 25 MG PO TABS
25.0000 mg | ORAL_TABLET | Freq: Every day | ORAL | 3 refills | Status: DC
  Filled 2021-12-05: qty 90, 90d supply, fill #0
  Filled 2022-02-28: qty 90, 90d supply, fill #1
  Filled 2022-05-29: qty 90, 90d supply, fill #2
  Filled 2022-10-03: qty 90, 90d supply, fill #3

## 2021-12-06 LAB — COMPREHENSIVE METABOLIC PANEL
ALT: 29 IU/L (ref 0–32)
AST: 23 IU/L (ref 0–40)
Albumin/Globulin Ratio: 2.3 — ABNORMAL HIGH (ref 1.2–2.2)
Albumin: 4.8 g/dL (ref 3.9–4.9)
Alkaline Phosphatase: 54 IU/L (ref 44–121)
BUN/Creatinine Ratio: 15 (ref 9–23)
BUN: 11 mg/dL (ref 6–24)
Bilirubin Total: 0.8 mg/dL (ref 0.0–1.2)
CO2: 23 mmol/L (ref 20–29)
Calcium: 9.3 mg/dL (ref 8.7–10.2)
Chloride: 104 mmol/L (ref 96–106)
Creatinine, Ser: 0.72 mg/dL (ref 0.57–1.00)
Globulin, Total: 2.1 g/dL (ref 1.5–4.5)
Glucose: 88 mg/dL (ref 70–99)
Potassium: 4.1 mmol/L (ref 3.5–5.2)
Sodium: 140 mmol/L (ref 134–144)
Total Protein: 6.9 g/dL (ref 6.0–8.5)
eGFR: 106 mL/min/{1.73_m2} (ref >59)

## 2021-12-28 DIAGNOSIS — Z6825 Body mass index (BMI) 25.0-25.9, adult: Secondary | ICD-10-CM | POA: Diagnosis not present

## 2021-12-28 DIAGNOSIS — Z01419 Encounter for gynecological examination (general) (routine) without abnormal findings: Secondary | ICD-10-CM | POA: Diagnosis not present

## 2021-12-28 DIAGNOSIS — Z1231 Encounter for screening mammogram for malignant neoplasm of breast: Secondary | ICD-10-CM | POA: Diagnosis not present

## 2021-12-28 LAB — HM MAMMOGRAPHY

## 2021-12-29 ENCOUNTER — Encounter: Payer: Self-pay | Admitting: Gastroenterology

## 2022-01-02 ENCOUNTER — Encounter: Payer: Self-pay | Admitting: Internal Medicine

## 2022-02-03 ENCOUNTER — Other Ambulatory Visit: Payer: Self-pay

## 2022-02-03 ENCOUNTER — Other Ambulatory Visit (HOSPITAL_COMMUNITY): Payer: Self-pay

## 2022-02-03 ENCOUNTER — Ambulatory Visit (AMBULATORY_SURGERY_CENTER): Payer: Self-pay | Admitting: *Deleted

## 2022-02-03 VITALS — Ht 65.0 in | Wt 155.0 lb

## 2022-02-03 DIAGNOSIS — Z800 Family history of malignant neoplasm of digestive organs: Secondary | ICD-10-CM

## 2022-02-03 MED ORDER — NA SULFATE-K SULFATE-MG SULF 17.5-3.13-1.6 GM/177ML PO SOLN
1.0000 | Freq: Once | ORAL | 0 refills | Status: AC
  Filled 2022-02-03: qty 354, 1d supply, fill #0
  Filled 2022-02-14: qty 354, 7d supply, fill #0

## 2022-02-03 NOTE — Progress Notes (Signed)
No egg or soy allergy known to patient  No issues known to pt with past sedation with any surgeries or procedures Patient denies ever being told they had issues or difficulty with intubation  No FH of Malignant Hyperthermia Pt is not on diet pills Pt is not on  home 02  Pt is not on blood thinners  Pt denies issues with constipation but does not have bowel movement daily,extra miralax given in addition to take with prep. No A fib or A flutter Have any cardiac testing pending--NO Pt instructed to use Singlecare.com or GoodRx for a price reduction on prep    Patient's chart reviewed by Osvaldo Angst CNRA prior to previsit and patient appropriate for the [facility].  Previsit completed and red dot placed by patient's name on their procedure day (on provider's schedule).

## 2022-02-14 ENCOUNTER — Other Ambulatory Visit (HOSPITAL_COMMUNITY): Payer: Self-pay

## 2022-02-15 ENCOUNTER — Encounter: Payer: Self-pay | Admitting: Gastroenterology

## 2022-02-24 ENCOUNTER — Encounter: Payer: Self-pay | Admitting: Gastroenterology

## 2022-02-24 ENCOUNTER — Ambulatory Visit (AMBULATORY_SURGERY_CENTER): Payer: BC Managed Care – PPO | Admitting: Gastroenterology

## 2022-02-24 VITALS — BP 145/82 | HR 67 | Temp 97.3°F | Resp 15 | Ht 65.0 in | Wt 155.0 lb

## 2022-02-24 DIAGNOSIS — K633 Ulcer of intestine: Secondary | ICD-10-CM

## 2022-02-24 DIAGNOSIS — Z800 Family history of malignant neoplasm of digestive organs: Secondary | ICD-10-CM

## 2022-02-24 DIAGNOSIS — D128 Benign neoplasm of rectum: Secondary | ICD-10-CM

## 2022-02-24 DIAGNOSIS — K621 Rectal polyp: Secondary | ICD-10-CM | POA: Diagnosis not present

## 2022-02-24 DIAGNOSIS — Z1211 Encounter for screening for malignant neoplasm of colon: Secondary | ICD-10-CM

## 2022-02-24 MED ORDER — SODIUM CHLORIDE 0.9 % IV SOLN: 500.0000 mL | Freq: Once | INTRAVENOUS | Status: DC

## 2022-02-24 NOTE — Progress Notes (Signed)
Pt's states no medical or surgical changes since previsit or office visit. 

## 2022-02-24 NOTE — Progress Notes (Signed)
Referring Provider: Rita Ohara, MD Primary Care Physician:  Rita Ohara, MD  Indication for Colonoscopy:  Colon cancer screening   IMPRESSION:  Need for colon cancer screening Appropriate candidate for monitored anesthesia care  PLAN: Colonoscopy in the [place] today   HPI: Sharon Beck is a 47 y.o. female presents for screening colonoscopy.  No prior colonoscopy or colon cancer screening.  Father with colon cancer at age 8. Mother with colon polyps. No other known family history of colon cancer or polyps. No family history of uterine/endometrial cancer, pancreatic cancer or gastric/stomach cancer.   Past Medical History:  Diagnosis Date   Abnormal Pap smear 2000   Anxiety    Especially around my cycle   Car sickness    Discharge from left nipple    Family history of colon cancer in father 94   H/O: depression    History of chicken pox    History of gestational diabetes 10/15/2015   Hypertension 02/18/2018   Yeast infection     Past Surgical History:  Procedure Laterality Date   BREAST SURGERY  2019   Papilomma removal   NO PAST SURGERIES     RADIOACTIVE SEED GUIDED EXCISIONAL BREAST BIOPSY Left 12/06/2017   Procedure: RADIOACTIVE SEED GUIDED EXCISIONAL BREAST BIOPSY;  Surgeon: Sharon Bookbinder, MD;  Location: [facility];  Service: General;  Laterality: Left;    Current Outpatient Medications  Medication Sig Dispense Refill   losartan (COZAAR) 25 MG tablet Take 1 tablet (25 mg total) by mouth daily. 90 tablet 3   ALPRAZolam (XANAX) 0.5 MG tablet Take 1 tablet by mouth 3 times a day. 30 tablet 0   Current Facility-Administered Medications  Medication Dose Route Frequency Provider Last Rate Last Admin   0.9 %  sodium chloride infusion  500 mL Intravenous Once Sharon Park, MD        Allergies as of 02/24/2022 - Review Complete 02/24/2022  Allergen Reaction Noted   Aspirin Rash 02/07/2011    Family History  Problem Relation Age  of Onset   Colon polyps Mother    Hyperlipidemia Mother    Hypertension Mother    Colon cancer Father 42   Hyperlipidemia Father    Hypertension Father    Diabetes Father    Cancer Father    Aneurysm Sister    Thyroid disease Sister    Drug abuse Brother    Hyperlipidemia Maternal Grandmother    Cervical cancer Maternal Grandmother    Stroke Maternal Grandmother    Hyperlipidemia Maternal Grandfather    Hyperlipidemia Paternal Grandmother    Diabetes Other    Hyperlipidemia Other    Hypertension Other    Anesthesia problems Neg Hx    Crohn's disease Neg Hx    Esophageal cancer Neg Hx    Rectal cancer Neg Hx    Stomach cancer Neg Hx    Ulcerative colitis Neg Hx      Physical Exam: General:   Alert,  well-nourished, pleasant and cooperative in NAD Head:  Normocephalic and atraumatic. Eyes:  Sclera clear, no icterus.   Conjunctiva pink. Mouth:  No deformity or lesions.   Neck:  Supple; no masses or thyromegaly. Lungs:  Clear throughout to auscultation.   No wheezes. Heart:  Regular rate and rhythm; no murmurs. Abdomen:  Soft, non-tender, nondistended, normal bowel sounds, no rebound or guarding.  Msk:  Symmetrical. No boney deformities LAD: No inguinal or umbilical LAD Extremities:  No clubbing or edema. Neurologic:  Alert and  oriented  x4;  grossly nonfocal Skin:  No obvious rash or bruise. Psych:  Alert and cooperative. Normal mood and affect.     Studies/Results: No results found.    Sharon Stang L. Tarri Glenn, MD, MPH 02/24/2022, 11:12 AM

## 2022-02-24 NOTE — Patient Instructions (Signed)
Handout on hemorrhoids, polyps, high fiber diet, and diverticulosis given. Await pathology results. Resume previous diet and continue present medications - avoid all NSAID's (aspirin, ibuprofen, naproxen, or other non-steroidal anti-inflammatory medications) Repeat colonoscopy in 5 years for surveillance  Emerging evidence supports eating a diet of fruits, vegetables, grains, calcium, and yogurt while reducing red meat and alcohol may reduce the risk of colon cancer   YOU HAD AN ENDOSCOPIC PROCEDURE TODAY AT [facility]:   Refer to the procedure report that was given to you for any specific questions about what was found during the examination.  If the procedure report does not answer your questions, please call your gastroenterologist to clarify.  If you requested that your care partner not be given the details of your procedure findings, then the procedure report has been included in a sealed envelope for you to review at your convenience later.  YOU SHOULD EXPECT: Some feelings of bloating in the abdomen. Passage of more gas than usual.  Walking can help get rid of the air that was put into your GI tract during the procedure and reduce the bloating. If you had a lower endoscopy (such as a colonoscopy or flexible sigmoidoscopy) you may notice spotting of blood in your stool or on the toilet paper. If you underwent a bowel prep for your procedure, you may not have a normal bowel movement for a few days.  Please Note:  You might notice some irritation and congestion in your nose or some drainage.  This is from the oxygen used during your procedure.  There is no need for concern and it should clear up in a day or so.  SYMPTOMS TO REPORT IMMEDIATELY:  Following lower endoscopy (colonoscopy or flexible sigmoidoscopy):  Excessive amounts of blood in the stool  Significant tenderness or worsening of abdominal pains  Swelling of the abdomen that is new, acute  Fever of 100F or  higher  For urgent or emergent issues, a gastroenterologist can be reached at any hour by calling [phone]. Do not use MyChart messaging for urgent concerns.    DIET:  We do recommend a small meal at first, but then you may proceed to your regular diet.  Drink plenty of fluids but you should avoid alcoholic beverages for 24 hours.  ACTIVITY:  You should plan to take it easy for the rest of today and you should NOT DRIVE or use heavy machinery until tomorrow (because of the sedation medicines used during the test).    FOLLOW UP: Our staff will call the number listed on your records the next business day following your procedure.  We will call around 7:15- 8:00 am to check on you and address any questions or concerns that you may have regarding the information given to you following your procedure. If we do not reach you, we will leave a message.     If any biopsies were taken you will be contacted by phone or by letter within the next 1-3 weeks.  Please call us at [phone] if you have not heard about the biopsies in 3 weeks.    SIGNATURES/CONFIDENTIALITY: You and/or your care partner have signed paperwork which will be entered into your electronic medical record.  These signatures attest to the fact that that the information above on your After Visit Summary has been reviewed and is understood.  Full responsibility of the confidentiality of this discharge information lies with you and/or your care-partner.

## 2022-02-24 NOTE — Progress Notes (Signed)
Called to room to assist during endoscopic procedure.  Patient ID and intended procedure confirmed with present staff. Received instructions for my participation in the procedure from the performing physician.  

## 2022-02-24 NOTE — Op Note (Signed)
[facility] Patient Name: Sharon Beck Procedure Date: 02/24/2022 11:12 AM MRN: [id number] Endoscopist: Thornton Park MD, MD, [id number] Age: 47 Referring MD:  Date of Birth: Jan 29, 1975 Gender: Female Account #: [id number] Procedure:                Colonoscopy Indications:              Screening for colorectal malignant neoplasm, This                            is the patient's first colonoscopy                           Father with colon cancer at age 45. Mother with                            colon polyps. Medicines:                Monitored Anesthesia Care Procedure:                Pre-Anesthesia Assessment:                           - Prior to the procedure, a History and Physical                            was performed, and patient medications and                            allergies were reviewed. The patient's tolerance of                            previous anesthesia was also reviewed. The risks                            and benefits of the procedure and the sedation                            options and risks were discussed with the patient.                            All questions were answered, and informed consent                            was obtained. Prior Anticoagulants: The patient has                            taken no anticoagulant or antiplatelet agents. ASA                            Grade Assessment: II - A patient with mild systemic                            disease. After reviewing the risks and benefits,  the patient was deemed in satisfactory condition to                            undergo the procedure.                           After obtaining informed consent, the colonoscope                            was passed under direct vision. Throughout the                            procedure, the patient's blood pressure, pulse, and                            oxygen saturations were monitored continuously. The                             Olympus CF-HQ190L [id number]) Colonoscope was                            introduced through the anus and advanced to the 5                            cm into the ileum. A second forward view of the                            right colon was performed. The colonoscopy was                            performed without difficulty. The patient tolerated                            the procedure well. The quality of the bowel                            preparation was good. The terminal ileum, ileocecal                            valve, appendiceal orifice, and rectum were                            photographed. Scope In: 11:22:23 AM Scope Out: 11:42:35 AM Scope Withdrawal Time: 0 hours 17 minutes 6 seconds  Total Procedure Duration: 0 hours 20 minutes 12 seconds  Findings:                 The perianal and digital rectal examinations were                            normal.                           Non-bleeding internal hemorrhoids were found.  A few medium-mouthed and small-mouthed diverticula                            were found in the sigmoid colon and ascending colon.                           A 3 mm polyp was found in the rectum. The polyp was                            sessile. The polyp was removed with a cold snare.                            Resection and retrieval were complete. Estimated                            blood loss was minimal.                           A few four mm ulcers were found at the ileocecal                            valve. No bleeding was present. No stigmata of                            recent bleeding were seen. Biopsies were taken from                            the ulcer and throughout the colon with a cold                            forceps for histology. Estimated blood loss was                            minimal.                           The terminal ileum appeared normal. Biopsies were                             taken with a cold forceps for histology. Estimated                            blood loss was minimal.                           The exam was otherwise without abnormality on                            direct and retroflexion views. Complications:            No immediate complications. Estimated Blood Loss:     Estimated blood loss was minimal. Impression:               - Non-bleeding internal hemorrhoids.                           -  Diverticulosis in the sigmoid colon and in the                            ascending colon.                           - One 3 mm polyp in the rectum, removed with a cold                            snare. Resected and retrieved.                           - A few ulcers at the ileocecal valve. Likely due                            to NSAIDs. Biopsied.                           - The examined portion of the ileum was normal.                            Biopsied.                           - The examination was otherwise normal on direct                            and retroflexion views. Recommendation:           - Patient has a contact number available for                            emergencies. The signs and symptoms of potential                            delayed complications were discussed with the                            patient. Return to normal activities tomorrow.                            Written discharge instructions were provided to the                            patient.                           - Resume previous diet. High fiber diet recommended.                           - Continue present medications.                           - Avoid all NSAIDs.                           -  Await pathology results.                           - Repeat colonoscopy in 5 years for surveillance.                           - Emerging evidence supports eating a diet of                            fruits, vegetables, grains, calcium, and yogurt                             while reducing red meat and alcohol may reduce the                            risk of colon cancer.                           - Thank you for allowing me to be involved in your                            colon cancer prevention. Thornton Park MD, MD 02/24/2022 11:48:41 AM This report has been signed electronically.

## 2022-02-24 NOTE — Progress Notes (Signed)
VSS NAD TRANS TO PACU

## 2022-02-27 ENCOUNTER — Telehealth: Payer: Self-pay

## 2022-02-27 NOTE — Telephone Encounter (Signed)
Attempted f/u call. No answer. Left VM

## 2022-02-28 ENCOUNTER — Other Ambulatory Visit: Payer: Self-pay

## 2022-05-29 ENCOUNTER — Encounter: Payer: Self-pay | Admitting: Family Medicine

## 2022-05-29 MED ORDER — ALPRAZOLAM 0.5 MG PO TABS
0.5000 mg | ORAL_TABLET | Freq: Three times a day (TID) | ORAL | 0 refills | Status: DC
  Filled 2022-05-29: qty 30, 10d supply, fill #0

## 2022-05-29 NOTE — Telephone Encounter (Signed)
PDMP reviewed.  Last filled #30 on 02/15/2021 by Dr. Algie Coffer.

## 2022-05-30 ENCOUNTER — Other Ambulatory Visit: Payer: Self-pay

## 2022-08-23 NOTE — Progress Notes (Signed)
No chief complaint on file.  Fell 2 months ago. Has persistent pain in R knee, worse with going up and down stairs.   PMH, PSH, SH reviewed   ROS:    PHYSICAL EXAM:  There were no vitals taken for this visit.      ASSESSMENT/PLAN:

## 2022-08-24 ENCOUNTER — Other Ambulatory Visit (HOSPITAL_COMMUNITY): Payer: Self-pay

## 2022-08-24 ENCOUNTER — Encounter: Payer: Self-pay | Admitting: Family Medicine

## 2022-08-24 ENCOUNTER — Other Ambulatory Visit: Payer: Self-pay

## 2022-08-24 ENCOUNTER — Ambulatory Visit (INDEPENDENT_AMBULATORY_CARE_PROVIDER_SITE_OTHER): Payer: BC Managed Care – PPO | Admitting: Family Medicine

## 2022-08-24 VITALS — BP 140/80 | HR 72 | Ht 65.0 in | Wt 156.2 lb

## 2022-08-24 DIAGNOSIS — M25561 Pain in right knee: Secondary | ICD-10-CM

## 2022-08-24 MED ORDER — MELOXICAM 15 MG PO TABS
15.0000 mg | ORAL_TABLET | Freq: Every day | ORAL | 0 refills | Status: DC
  Filled 2022-08-24 – 2022-08-25 (×2): qty 15, 15d supply, fill #0

## 2022-08-24 NOTE — Patient Instructions (Signed)
I recommend using a compression sleeve to the right knee (neoprene sleeve). Ice the knee if it is painful or swollen. Avoid a lot of deep knee bends and stairs. It is fine to swim, exercise bike--things that don't cause pain (listen to your body).  Take meloxicam once daily with food. If it bothers your stomach you can add prilosec daily for when you are taking this, and/or cut the dose in half. Take it daily until your pain has completely resolved.  You do not have to finish the bottle.  If your knee pain isn't improving, next steps would be an x-ray and possibly physical therapy vs referral to sports medicine doctor or orthopedist.

## 2022-08-25 ENCOUNTER — Other Ambulatory Visit (HOSPITAL_COMMUNITY): Payer: Self-pay

## 2022-08-25 ENCOUNTER — Other Ambulatory Visit: Payer: Self-pay

## 2022-10-03 ENCOUNTER — Other Ambulatory Visit: Payer: Self-pay

## 2022-10-03 ENCOUNTER — Encounter: Payer: Self-pay | Admitting: Pharmacist

## 2022-10-03 ENCOUNTER — Other Ambulatory Visit: Payer: Self-pay | Admitting: Family Medicine

## 2022-10-03 ENCOUNTER — Other Ambulatory Visit (HOSPITAL_COMMUNITY): Payer: Self-pay

## 2022-10-03 MED ORDER — ALPRAZOLAM 0.5 MG PO TABS
0.5000 mg | ORAL_TABLET | Freq: Three times a day (TID) | ORAL | 0 refills | Status: DC
  Filled 2022-10-03: qty 30, 10d supply, fill #0

## 2022-12-25 DIAGNOSIS — H31091 Other chorioretinal scars, right eye: Secondary | ICD-10-CM | POA: Diagnosis not present

## 2022-12-27 ENCOUNTER — Encounter: Payer: Self-pay | Admitting: *Deleted

## 2022-12-27 NOTE — Progress Notes (Unsigned)
No chief complaint on file.  Sharon Beck is a 47 y.o. female who presents for a complete physical.    Last seen here with right knee pain after a fall, in August 2024.  Anxiety, worse around her menstrual cycles. She previously reported taking alprazolam 0.5mg  1/2 tablet 2-3x/month, usually the week before her cycle.  Per PDMP, has been refilling #30 about every 4 months, last refill in 09/2022.  A former doctor in [place] diagnosed PMDD, was put on an antidepressant.  She prefers to avoid antidepressant medication, denies depression. Last year she reported some decrease in libido, attributed to "life".  Denies vaginal dryness or discomfort, no marital issues, just tired (wakes up at 5, to bed at 10, running all day long, busy). Some weight gain noted last year, reported "munching" when stressed.  Hypertension: Denies dizziness, headaches, chest pain.  Denies side effects of medications. She reports compliance with taking losartan 25mg  daily. BP's are running    BP Readings from Last 3 Encounters:  08/24/22 (!) 140/80  02/24/22 (!) 145/82  12/05/21 110/72   Family history of cerebral aneurysms.  Her sister passed away at the age of 28 due to ruptured aneurysm (also had HTN, smoker), and reportedly also has an aunt who had the same (in her late 22's).  She had normal MRI/MRA in 11/2020.  Immunization History  Administered Date(s) Administered   Influenza,inj,Quad PF,6+ Mos 11/19/2019   Influenza-Unspecified 10/05/2015, 09/30/2016, 09/20/2017, 10/17/2018, 11/05/2020, 10/28/2021   PFIZER(Purple Top)SARS-COV-2 Vaccination 01/28/2019, 02/18/2019, 12/23/2019   Pfizer Covid-19 Vaccine Bivalent Booster 70yrs & up 10/22/2020   Tdap 03/12/2014   Last Pap smear: 01/2018; last GYN visit was 12/2021 Last mammogram: 12/2021 Last colonoscopy: 02/2022, focal lymphoid aggregates, hyperplastic polyp. 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  F/u colonoscopy rec  5 years d/t family history. Dentist: every 6 months Ophtho: yearly Exercise:   walks on treadmill x 30 minutes 2-3x/week; walks the dog daily (not aerobic). Active with kids, hiking.  Calcium--occasional yogurt, occasional cheese stick, some cheese on a salad, doesn't drink milk.   Vitamin D-OH 31.7 11/2020 Lipid screen: Lab Results  Component Value Date   CHOL 197 11/25/2020   HDL 60 11/25/2020   LDLCALC 116 (H) 11/25/2020   TRIG 121 11/25/2020   CHOLHDL 3.3 11/25/2020   Thyroid screen: Lab Results  Component Value Date   TSH 1.840 11/19/2019   Other providers: Dr. Dwain Sarna- surgeon [facility] - Dr. Alison Stalling at [facility]  [facility]    PMH, [facility], [facility] and FH were reviewed and updated. H/o GDM, HTN FH thyroid disease, cerebral aneurysms, colon CA, HTN, DM, HLD     ROS:  The patient denies anorexia, fever, weight changes, headaches,  vision changes, decreased hearing, ear pain, sore throat, breast concerns, chest pain, palpitations, dizziness, syncope, dyspnea on exertion, cough, swelling, nausea, vomiting, diarrhea, constipation, abdominal pain, melena, hematochezia, indigestion/heartburn, hematuria, incontinence, dysuria, irregular menstrual cycles, vaginal discharge, odor or itch, genital lesions, joint pains, numbness, tingling, weakness, tremor, suspicious skin lesions, abnormal bleeding/bruising, or enlarged lymph nodes.  Some anxiety and decreased libido, per HPI. Occasional skipped beat, notices at night only. Infrequent R knee pain?   PHYSICAL EXAM:  There were no vitals taken for this visit.  Wt Readings from Last 3 Encounters:  08/24/22 156 lb 3.2 oz (70.9 kg)  02/24/22 155 lb (70.3 kg)  02/03/22 155 lb (70.3 kg)   General Appearance:    Alert, cooperative,  no distress, appears stated age  Head:    Normocephalic, without obvious abnormality, atraumatic  Eyes:    PERRL, conjunctiva/corneas clear, EOM's intact  Ears:     Normal TM's and external ear canals  Nose:   No drainage or sinus tenderness  Throat:   Normal mucosa  Neck:   Supple, no lymphadenopathy;  thyroid:  no enlargement/ tenderness/nodules; no JVD  Back:    Spine nontender, no curvature, ROM normal, no CVA  tenderness  Lungs:     Clear to auscultation bilaterally without wheezes, rales or   ronchi; respirations unlabored  Chest Wall:    No tenderness or deformity   Heart:    Regular rate and rhythm, S1 and S2 normal, no murmur, rub   or gallop  Breast Exam:    Deferred to OB/GYN  Abdomen:     Soft, non-tender, nondistended, normoactive bowel sounds,    no masses, no hepatosplenomegaly  Genitalia:    Deferred to OB/GYN       Extremities:   No clubbing, cyanosis or edema  Pulses:   2+ and symmetric all extremities  Skin:   Skin color, texture, turgor normal, no rashes or lesions.   Lymph nodes:   Cervical, supraclavicular nodes normal  Neurologic:   Normal strength, sensation and gait; reflexes 2+ and symmetric throughout                               Psych:   Normal mood, affect, hygiene and grooming.    ASSESSMENT/PLAN:   Flu, COVID  Add cbc, lipids, D (if not taking any supplement)  I see mammo from 12/2021, don't see notes from GYN visit in system. Did she have pap?  Showing gap--was due in 2023 if didn't have HPV testing in 2020. Does she have appt scheduled?   Discussed monthly self breast exams and yearly mammograms (will get report from GYN from 2022, scheduled for December 2023); at least 30 minutes of aerobic activity at least 5 days/week, weight-bearing exercise at least 2x/week; proper sunscreen use reviewed; healthy diet, including goals of calcium and vitamin D intake and alcohol recommendations (less than or equal to 1 drink/day) reviewed; regular seatbelt use; changing batteries in smoke detectors.  Immunization recommendations discussed--continue yearly flu shots.  Declined COVID booster Colonoscopy recommendations  reviewed, age 69.

## 2022-12-27 NOTE — Patient Instructions (Incomplete)
  HEALTH MAINTENANCE RECOMMENDATIONS:  It is recommended that you get at least 30 minutes of aerobic exercise at least 5 days/week (for weight loss, you may need as much as 60-90 minutes). This can be any activity that gets your heart rate up. This can be divided in 10-15 minute intervals if needed, but try and build up your endurance at least once a week.  Weight bearing exercise is also recommended twice weekly.  Eat a healthy diet with lots of vegetables, fruits and fiber.  "Colorful" foods have a lot of vitamins (ie green vegetables, tomatoes, red peppers, etc).  Limit sweet tea, regular sodas and alcoholic beverages, all of which has a lot of calories and sugar.  Up to 1 alcoholic drink daily may be beneficial for women (unless trying to lose weight, watch sugars).  Drink a lot of water.  Calcium recommendations are 1200-1500 mg daily (1500 mg for postmenopausal women or women without ovaries), and vitamin D 1000 IU daily.  This should be obtained from diet and/or supplements (vitamins), and calcium should not be taken all at once, but in divided doses.  Monthly self breast exams and yearly mammograms for women over the age of 11 is recommended.  Sunscreen of at least SPF 30 should be used on all sun-exposed parts of the skin when outside between the hours of 10 am and 4 pm (not just when at beach or pool, but even with exercise, golf, tennis, and yard work!)  Use a sunscreen that says "broad spectrum" so it covers both UVA and UVB rays, and make sure to reapply every 1-2 hours.  Remember to change the batteries in your smoke detectors when changing your clock times in the spring and fall. Carbon monoxide detectors are recommended for your home.  Use your seat belt every time you are in a car, and please drive safely and not be distracted with cell phones and texting while driving.  Start the Buspar as discussed (5mg  twice daily, quickly titrating up to 7.5 mg twice daily, then increase by 2.5  mg once a week, if needed (max of 15 mg twice daily).  We are referring you to sports medicine to further evaluate your ongoing knee discomfort.  Monitor the blood pressure on the log provided.  We can discuss these values at your visit in 4-6 weeks.  Document comments, as these can be helpful (sodium-rich foods, stress, exercise, etc).  We briefly discussed propranolol to be used for performance anxiety, rather than the alprazolam.  Let's see how the buspar works.

## 2022-12-28 ENCOUNTER — Encounter: Payer: Self-pay | Admitting: Family Medicine

## 2022-12-28 ENCOUNTER — Other Ambulatory Visit (HOSPITAL_COMMUNITY): Payer: Self-pay

## 2022-12-28 ENCOUNTER — Other Ambulatory Visit: Payer: Self-pay

## 2022-12-28 ENCOUNTER — Ambulatory Visit (INDEPENDENT_AMBULATORY_CARE_PROVIDER_SITE_OTHER): Payer: BC Managed Care – PPO | Admitting: Family Medicine

## 2022-12-28 VITALS — BP 124/80 | HR 72 | Ht 65.0 in | Wt 158.8 lb

## 2022-12-28 DIAGNOSIS — I10 Essential (primary) hypertension: Secondary | ICD-10-CM

## 2022-12-28 DIAGNOSIS — Z0000 Encounter for general adult medical examination without abnormal findings: Secondary | ICD-10-CM

## 2022-12-28 DIAGNOSIS — M25561 Pain in right knee: Secondary | ICD-10-CM

## 2022-12-28 DIAGNOSIS — F411 Generalized anxiety disorder: Secondary | ICD-10-CM

## 2022-12-28 MED ORDER — BUSPIRONE HCL 15 MG PO TABS
ORAL_TABLET | ORAL | 0 refills | Status: DC
Start: 2022-12-28 — End: 2023-02-21
  Filled 2022-12-28: qty 60, 30d supply, fill #0

## 2022-12-28 MED ORDER — LOSARTAN POTASSIUM 25 MG PO TABS
25.0000 mg | ORAL_TABLET | Freq: Every day | ORAL | 3 refills | Status: DC
  Filled 2022-12-28: qty 90, 90d supply, fill #0
  Filled 2023-03-20: qty 90, 90d supply, fill #1
  Filled 2023-07-06: qty 90, 90d supply, fill #2
  Filled 2023-09-30: qty 90, 90d supply, fill #3

## 2022-12-29 ENCOUNTER — Other Ambulatory Visit: Payer: BC Managed Care – PPO

## 2022-12-29 DIAGNOSIS — Z0000 Encounter for general adult medical examination without abnormal findings: Secondary | ICD-10-CM | POA: Diagnosis not present

## 2022-12-29 DIAGNOSIS — I10 Essential (primary) hypertension: Secondary | ICD-10-CM | POA: Diagnosis not present

## 2022-12-29 LAB — LIPID PANEL

## 2022-12-30 LAB — CBC WITH DIFFERENTIAL/PLATELET
Basophils Absolute: 0.1 10*3/uL (ref 0.0–0.2)
Basos: 1 %
EOS (ABSOLUTE): 0.2 10*3/uL (ref 0.0–0.4)
Eos: 5 %
Hematocrit: 42 % (ref 34.0–46.6)
Hemoglobin: 14.2 g/dL (ref 11.1–15.9)
Immature Grans (Abs): 0 10*3/uL (ref 0.0–0.1)
Immature Granulocytes: 0 %
Lymphocytes Absolute: 1.7 10*3/uL (ref 0.7–3.1)
Lymphs: 34 %
MCH: 31.8 pg (ref 26.6–33.0)
MCHC: 33.8 g/dL (ref 31.5–35.7)
MCV: 94 fL (ref 79–97)
Monocytes Absolute: 0.3 10*3/uL (ref 0.1–0.9)
Monocytes: 6 %
Neutrophils Absolute: 2.6 10*3/uL (ref 1.4–7.0)
Neutrophils: 54 %
Platelets: 258 10*3/uL (ref 150–450)
RBC: 4.46 x10E6/uL (ref 3.77–5.28)
RDW: 12.1 % (ref 11.7–15.4)
WBC: 4.9 10*3/uL (ref 3.4–10.8)

## 2022-12-30 LAB — CMP14+EGFR
ALT: 35 IU/L — ABNORMAL HIGH (ref 0–32)
AST: 25 IU/L (ref 0–40)
Albumin: 4.8 g/dL (ref 3.9–4.9)
Alkaline Phosphatase: 68 IU/L (ref 44–121)
BUN/Creatinine Ratio: 20 (ref 9–23)
BUN: 15 mg/dL (ref 6–24)
Bilirubin Total: 0.5 mg/dL (ref 0.0–1.2)
CO2: 23 mmol/L (ref 20–29)
Calcium: 9.5 mg/dL (ref 8.7–10.2)
Chloride: 104 mmol/L (ref 96–106)
Creatinine, Ser: 0.76 mg/dL (ref 0.57–1.00)
Globulin, Total: 2.4 g/dL (ref 1.5–4.5)
Glucose: 89 mg/dL (ref 70–99)
Potassium: 4.4 mmol/L (ref 3.5–5.2)
Sodium: 142 mmol/L (ref 134–144)
Total Protein: 7.2 g/dL (ref 6.0–8.5)
eGFR: 98 mL/min/{1.73_m2} (ref >59)

## 2022-12-30 LAB — LIPID PANEL
Cholesterol, Total: 223 mg/dL — ABNORMAL HIGH (ref 100–199)
HDL: 59 mg/dL (ref >39)
LDL CALC COMMENT:: 3.8 ratio (ref 0.0–4.4)
LDL Chol Calc (NIH): 143 mg/dL — ABNORMAL HIGH (ref 0–99)
Triglycerides: 119 mg/dL (ref 0–149)
VLDL Cholesterol Cal: 21 mg/dL (ref 5–40)

## 2022-12-30 LAB — VITAMIN D 25 HYDROXY (VIT D DEFICIENCY, FRACTURES): Vit D, 25-Hydroxy: 27.3 ng/mL — ABNORMAL LOW (ref 30.0–100.0)

## 2023-01-02 ENCOUNTER — Encounter: Payer: Self-pay | Admitting: Family Medicine

## 2023-01-31 ENCOUNTER — Encounter: Payer: Self-pay | Admitting: Family Medicine

## 2023-01-31 DIAGNOSIS — Z1331 Encounter for screening for depression: Secondary | ICD-10-CM | POA: Diagnosis not present

## 2023-01-31 DIAGNOSIS — Z01419 Encounter for gynecological examination (general) (routine) without abnormal findings: Secondary | ICD-10-CM | POA: Diagnosis not present

## 2023-01-31 DIAGNOSIS — Z1231 Encounter for screening mammogram for malignant neoplasm of breast: Secondary | ICD-10-CM | POA: Diagnosis not present

## 2023-01-31 LAB — HM MAMMOGRAPHY

## 2023-01-31 NOTE — Progress Notes (Signed)
Start time: 4:04 End time: 4:34  Virtual Visit via Video Note  I connected with Anis Levenstein on 02/01/23 by a video enabled telemedicine application and verified that I am speaking with the correct person using two identifiers.  Location: Patient: home Provider: office   I discussed the limitations of evaluation and management by telemedicine and the availability of in person appointments. The patient expressed understanding and agreed to proceed.  History of Present Illness:  Chief Complaint  Patient presents with   Follow-up    VIRTUAL follow up on anxiety and blood pressure. Still taking 5mg  of buspar qam and 7.5mg  at bedtime.    Patient presents to f/u on anxiety and blood pressure.  At her physical last month, she reported worsening of her anxiety around her cycles. She reported her moods as "unbearable" prior to her cycle.  She has been taking alprazolam 0.5mg  1/2 tablet 2-3x the week before her cycle.  She also reports taking some xanax if she has to present at work, or lead a group at [employer].   She recalled previously taking zoloft and lexapro for PMDD, and recalled them affecting her libido, which is already a problem for her. She also wasn't interested in anything that could adversely affect her weight. At her physical, she reported zero sex drive (prior to any medications).  At her physical, we discussed anxiety and treatments in detail. Discussed SSRI (risks/SE), Buspar, and propranolol for performance anxiety; discussed mindfulness, exercise, healthy diet to help with moods. Decided to try Buspar, titration reviewed.  She has been taking 5mg  in the morning 7.5 mg at night for the last 7-10 days. She is having dizziness, lightheadedness and nausea.  It lasts for up to 30 minutes after her morning dose of 5mg . She takes the evening one at bedtime, so isn't aware of SE. She has to eat crackers in order to tolerate it. She feels like it helps her anxiety and  agitation, but side effects are terrible. 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 She can tell the medication is helping with her anxiety, but is tired of having to eat crackers/bread to tolerate it.  She saw Dr. Algie Coffer yesterday, and discussed her anxiety, libido--states we are on the same page. She is willing to re-try SSRI, if needed.   Hypertension: Denies dizziness, headaches, chest pain.  Denies side effects of medications. She reports compliance with taking losartan 25mg  daily. She has been monitoring BP at home, seeing 121-138/79-89, pulse 70-87. Some higher BP's (140's) when checked prior to medication (took med later in day).  Having more crackers related to nausea.  BP Readings from Last 3 Encounters:  02/01/23 135/79  12/28/22 124/80  08/24/22 (!) 140/80   She denies headaches, chest pain.  R patellar pain--referred to sports medicine. Scheduled with Dr. Katrinka Blazing next month.  PMH, PSH, SH reviewed  Outpatient Encounter Medications as of 02/01/2023  Medication Sig Note   busPIRone (BUSPAR) 15 MG tablet Start 5mg  (1/3 tablet) 2-3 times/day, titrate up by 2.5 mg once a week (up to max of 15 mg twice daily) 02/01/2023: 5mg  qam and 7.5mg  qhs    losartan (COZAAR) 25 MG tablet Take 1 tablet (25 mg total) by mouth daily.    ALPRAZolam (XANAX) 0.5 MG tablet Take 1 tablet (0.5 mg total) by mouth 3 (three) times daily. (Patient not taking: Reported on 02/01/2023) 02/01/2023: Prn, has not needed   No facility-administered encounter medications on  file as of 02/01/2023.   Allergies  Allergen Reactions   Aspirin Rash    Pt tolerates ibuprofen   ROS: no headaches, chest pain, shortness of breath.  No f/c or URI symptoms. Anxiety is improved, per HPI. +nausea and dizziness after taking morning buspar, per HPI    Observations/Objective:  BP 135/79   Pulse 75   Ht 5\' 5"  (1.651 m)   Wt 155 lb (70.3 kg)   LMP 01/17/2023  (Exact Date)   BMI 25.79 kg/m  Wt Readings from Last 3 Encounters:  02/01/23 155 lb (70.3 kg)  12/28/22 158 lb 12.8 oz (72 kg)  08/24/22 156 lb 3.2 oz (70.9 kg)   Pleasant, well-appearing female, in good spirits. She is alert and oriented. Cranial nerves are grossly intact. Normal mood, affect, grooming, eye contact and speech.     02/01/2023    3:52 PM 12/28/2022    8:50 AM 10/22/2020   10:27 AM  GAD 7 : Generalized Anxiety Score  Nervous, Anxious, on Edge 1 2 2   Control/stop worrying 0 0 2  Worry too much - different things 0 1 2  Trouble relaxing 0 0 0  Restless 1 0 1  Easily annoyed or irritable 1 2 2   Afraid - awful might happen 0 2 1  Total GAD 7 Score 3 7 10   Anxiety Difficulty Not difficult at all Somewhat difficult Somewhat difficult      Assessment and Plan:  Generalized anxiety disorder - improved with buspar, trouble tolerating am dose of 5mg .  Hold am dose, then, if needed, restart at 2.5mg , titrat up prn. Change to SSRI if not able to tolerate  Essential hypertension - some borderline values at home. Crackers for nausea likely have more sodium, affecting BP. Continue 25 mg of losartan, and monitoring  Has a pill cutter--will call for 5 mg dose of buspar, if needed.   Stop the morning buspar, and continue the 7.5 mg nighttime dose. See how you feel for a few days, up to a week. If you notice worsening anxiety, then we can introduce a 2.5 mg morning dose. (You can use the pill cutter to cut the 1/3 of the tablet--5mg --or if this is too difficult, you can contact us for a new prescription for a 5 mg tablet, that should be scored, making it easier to get 2.5 mg dose). If you tolerate that dose, but anxiety isn't well controlled, you can add a 2nd dose of 2.5 mg mid-day (and continue the 7.5mg  at night).  If this isn't tolerable, we discussed the option of re-trying lexapro--10mg  tablet, but stay at 1/2 tablet for a while (rather than increasing to 10 mg after a  week). If tolerating, but no improvement in anxiety, you could increase to the full pill (after a minimum of a week at 1/2 tablet, but you could wait for 3-4 weeks to see a better effect).  3rd option would be to try Wellbutrin. This more typically helps with depression rather than straight anxiety, but each person is different, and could be worth a try.  Continue the current dose of losartan, 25mg . Your blood pressures are borderline, some are high. This could be contributed by you not feeling well, as a side effect from the buspar, or contributed by possibly higher sodium intake from the crackers you are eating to be able to tolerate the Buspar. Continue to monitor your blood pressure and keep [place] updated (ideal goal is <130/80, but occasional up to 135/85 is okay.  Follow Up Instructions:    I discussed the assessment and treatment plan with the patient. The patient was provided an opportunity to ask questions and all were answered. The patient agreed with the plan and demonstrated an understanding of the instructions.   The patient was advised to call back or seek an in-person evaluation if the symptoms worsen or if the condition fails to improve as anticipated.  I spent 33 minutes dedicated to the care of this patient, including pre-visit review of records, face to face time, post-visit ordering of testing and documentation.    Lavonda Jumbo, MD

## 2023-02-01 ENCOUNTER — Telehealth (INDEPENDENT_AMBULATORY_CARE_PROVIDER_SITE_OTHER): Payer: BC Managed Care – PPO | Admitting: Family Medicine

## 2023-02-01 ENCOUNTER — Encounter: Payer: Self-pay | Admitting: Family Medicine

## 2023-02-01 VITALS — BP 135/79 | HR 75 | Ht 65.0 in | Wt 155.0 lb

## 2023-02-01 DIAGNOSIS — I10 Essential (primary) hypertension: Secondary | ICD-10-CM | POA: Diagnosis not present

## 2023-02-01 DIAGNOSIS — F411 Generalized anxiety disorder: Secondary | ICD-10-CM | POA: Diagnosis not present

## 2023-02-01 NOTE — Patient Instructions (Signed)
Stop the morning buspar, and continue the 7.5 mg nighttime dose. See how you feel for a few days, up to a week. If you notice worsening anxiety, then we can introduce a 2.5 mg morning dose. (You can use the pill cutter to cut the 1/3 of the tablet--5mg --or if this is too difficult, you can contact us for a new prescription for a 5 mg tablet, that should be scored, making it easier to get 2.5 mg dose). If you tolerate that dose, but anxiety isn't well controlled, you can add a 2nd dose of 2.5 mg mid-day (and continue the 7.5mg  at night).  If this isn't tolerable, we discussed the option of re-trying lexapro--10mg  tablet, but stay at 1/2 tablet for a while (rather than increasing to 10 mg after a week). If tolerating, but no improvement in anxiety, you could increase to the full pill (after a minimum of a week at 1/2 tablet, but you could wait for 3-4 weeks to see a better effect).  3rd option would be to try Wellbutrin. This more typically helps with depression rather than straight anxiety, but each person is different, and could be worth a try.  Continue the current dose of losartan, 25mg . Your blood pressures are borderline, some are high. This could be contributed by you not feeling well, as a side effect from the buspar, or contributed by possibly higher sodium intake from the crackers you are eating to be able to tolerate the Buspar. Continue to monitor your blood pressure and keep [place] updated (ideal goal is <130/80, but occasional up to 135/85 is okay.

## 2023-02-20 ENCOUNTER — Encounter: Payer: Self-pay | Admitting: Family Medicine

## 2023-02-20 DIAGNOSIS — F411 Generalized anxiety disorder: Secondary | ICD-10-CM

## 2023-02-20 NOTE — Progress Notes (Signed)
 [facility] [address] [place] [address] Phone: [phone] Subjective:   Sharon Beck, am serving as a scribe for Dr. Arthea Claudene.  I'm seeing this patient by the request  of:  Randol Dawes, MD  CC: Right knee pain  YEP:Dlagzrupcz  Sharon Beck is a 48 y.o. female coming in with complaint of R knee pain. Patient states that she fell on an escalator and she hit her knee on a step in June. Patient tried a brace and meloxicam  from her PCP. Painful to walk up stairs and do any squatting. Feels pressure in joint. Also has pain if she crosses L leg over the R knee. Most of her pain is in anterior aspect surrounding patella.      Past Medical History:  Diagnosis Date   Abnormal Pap smear 2000   Anxiety    Especially around my cycle   Car sickness    Discharge from left nipple    Family history of colon cancer in father 28   H/O: depression    History of chicken pox    History of gestational diabetes 10/15/2015   Hypertension 02/18/2018   Yeast infection    Past Surgical History:  Procedure Laterality Date   BREAST SURGERY  2019   Papilomma removal   NO PAST SURGERIES     RADIOACTIVE SEED GUIDED EXCISIONAL BREAST BIOPSY Left 12/06/2017   Procedure: RADIOACTIVE SEED GUIDED EXCISIONAL BREAST BIOPSY;  Surgeon: Ebbie Cough, MD;  Location: [facility];  Service: General;  Laterality: Left;   Social History   Socioeconomic History   Marital status: Married    Spouse name: Not on file   Number of children: Not on file   Years of education: Not on file   Highest education level: Not on file  Occupational History   Not on file  Tobacco Use   Smoking status: Former    Current packs/day: 0.00    Average packs/day: 0.5 packs/day for 5.0 years (2.5 ttl pk-yrs)    Types: Cigarettes    Start date: 02/07/2000    Quit date: 02/06/2005    Years since quitting: 18.0   Smokeless tobacco: Never   Tobacco comments:     smoked 1/2 pack per week in college  Vaping Use   Vaping status: Never Used  Substance and Sexual Activity   Alcohol use: Yes    Comment: 0-3 glasses of wine per week   Drug use: No   Sexual activity: Yes    Comment: husband has had vasectomy  Other Topics Concern   Not on file  Social History Narrative   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 Is a [employer] also.    Lives with husband and 2 young children.    1 dog (mini-golden doodle, Patty), 1 cat   Husband is a teacher, early years/pre.      Updated 12/2022   Social Drivers of Health   Financial Resource Strain: Not on file  Food Insecurity: Not on file  Transportation Needs: Not on file  Physical Activity: Not on file  Stress: Not on file  Social Connections: Not on file   Allergies  Allergen Reactions   Aspirin Rash    Pt tolerates ibuprofen    Family History  Problem Relation Age of Onset   Colon polyps Mother    Hyperlipidemia Mother    Hypertension Mother    Colon cancer Father 8   Hyperlipidemia Father    Hypertension Father  Diabetes Father    Cancer Father    Aneurysm Sister    Thyroid disease Sister    Drug abuse Brother    Hyperlipidemia Maternal Grandmother    Cervical cancer Maternal Grandmother    Stroke Maternal Grandmother    Hyperlipidemia Maternal Grandfather    Hyperlipidemia Paternal Grandmother    Diabetes Other    Hyperlipidemia Other    Hypertension Other    Anesthesia problems Neg Hx    Crohn's disease Neg Hx    Esophageal cancer Neg Hx    Rectal cancer Neg Hx    Stomach cancer Neg Hx    Ulcerative colitis Neg Hx      Current Outpatient Medications (Cardiovascular):    losartan  (COZAAR ) 25 MG tablet, Take 1 tablet (25 mg total) by mouth daily.     Current Outpatient Medications (Other):    ALPRAZolam  (XANAX ) 0.5 MG tablet, Take 1 tablet (0.5 mg total) by mouth 3 (three) times daily.   busPIRone  (BUSPAR ) 15 MG tablet, Take 7.5 mg by mouth in the morning, 10 mg in the evening.   Titrate up as directed to 15 mg twice daily, if needed   Vitamin D , Ergocalciferol , (DRISDOL ) 1.25 MG (50000 UNIT) CAPS capsule, Take 1 capsule (50,000 Units total) by mouth every 7 (seven) days.   Reviewed prior external information including notes and imaging from  primary care provider As well as notes that were available from care everywhere and other healthcare systems.  Past medical history, social, surgical and family history all reviewed in electronic medical record.  No pertanent information unless stated regarding to the chief complaint.   Review of Systems:  No headache, visual changes, nausea, vomiting, diarrhea, constipation, dizziness, abdominal pain, skin rash, fevers, chills, night sweats, weight loss, swollen lymph nodes, body aches, joint swelling, chest pain, shortness of breath, mood changes. POSITIVE muscle aches  Objective  Blood pressure 124/82, pulse 75, height 5' 5 (1.651 m), weight 155 lb (70.3 kg), last menstrual period 01/17/2023, SpO2 98%.   General: No apparent distress alert and oriented x3 mood and affect normal, dressed appropriately.  HEENT: Pupils equal, extraocular movements intact  Respiratory: Patient's speak in full sentences and does not appear short of breath  Cardiovascular: No lower extremity edema, non tender, no erythema  Right knee does have significant laxity noted of the PCL compared to the contralateral side.  ACL does appear to be intact.  He does have some mild tenderness noted over the patella itself.  No grind test noted.  No varus or valgus and pain noted.  Limited muscular skeletal ultrasound was performed and interpreted by CLAUDENE HUSSAR, M  Limited ultrasound shows the patient does have some cortical irregularity noted.  This is of the patella.  Patient's medial and lateral meniscus appear to be intact.  No swelling in the posterior capsule of the knee.  Trace amount of hypoechoic changes in the patellofemoral joint. Impression: Very  small cortical irregularity noted at the most superior aspect anteriorly of the patella.    Impression and Recommendations:      The above documentation has been reviewed and is accurate and complete Chalee Hirota M Davien Malone, DO

## 2023-02-21 ENCOUNTER — Other Ambulatory Visit (HOSPITAL_COMMUNITY): Payer: Self-pay

## 2023-02-21 ENCOUNTER — Other Ambulatory Visit: Payer: Self-pay

## 2023-02-21 MED ORDER — BUSPIRONE HCL 15 MG PO TABS
ORAL_TABLET | ORAL | 2 refills | Status: DC
  Filled 2023-02-21: qty 60, 30d supply, fill #0

## 2023-02-22 ENCOUNTER — Ambulatory Visit: Payer: BC Managed Care – PPO

## 2023-02-22 ENCOUNTER — Encounter: Payer: Self-pay | Admitting: Family Medicine

## 2023-02-22 ENCOUNTER — Other Ambulatory Visit (HOSPITAL_COMMUNITY): Payer: Self-pay

## 2023-02-22 ENCOUNTER — Other Ambulatory Visit: Payer: Self-pay

## 2023-02-22 ENCOUNTER — Ambulatory Visit: Payer: BC Managed Care – PPO | Admitting: Family Medicine

## 2023-02-22 VITALS — BP 124/82 | HR 75 | Ht 65.0 in | Wt 155.0 lb

## 2023-02-22 DIAGNOSIS — Z8639 Personal history of other endocrine, nutritional and metabolic disease: Secondary | ICD-10-CM

## 2023-02-22 DIAGNOSIS — S8991XA Unspecified injury of right lower leg, initial encounter: Secondary | ICD-10-CM | POA: Diagnosis not present

## 2023-02-22 DIAGNOSIS — M25561 Pain in right knee: Secondary | ICD-10-CM | POA: Diagnosis not present

## 2023-02-22 MED ORDER — VITAMIN D (ERGOCALCIFEROL) 1.25 MG (50000 UNIT) PO CAPS
50000.0000 [IU] | ORAL_CAPSULE | ORAL | 0 refills | Status: DC
  Filled 2023-02-22: qty 12, 84d supply, fill #0

## 2023-02-22 NOTE — Assessment & Plan Note (Signed)
 Patient does have what appears to be more of a PCL injury.  Patient's x-rays do show that there may have been a very large contusion that is consistent with the ultrasound of the patella.  Could have had a potential fracture as well.  Seems to be slowly healing, found to have a low vitamin D .  Will put on once weekly vitamin D  and see how patient responds.  Discussed icing regimen and home exercises.  Increase activity slowly.  Follow-up again in 6 to 8 weeks

## 2023-02-22 NOTE — Patient Instructions (Addendum)
 Once weekly Vit D Topical arnica PCL exercises See me in 6-8 weeks

## 2023-02-22 NOTE — Assessment & Plan Note (Signed)
Once weekly vitamin D given. 

## 2023-03-19 ENCOUNTER — Other Ambulatory Visit (HOSPITAL_COMMUNITY): Payer: Self-pay

## 2023-03-19 ENCOUNTER — Other Ambulatory Visit: Payer: Self-pay

## 2023-03-19 MED ORDER — ESCITALOPRAM OXALATE 10 MG PO TABS
ORAL_TABLET | ORAL | 1 refills | Status: DC
  Filled 2023-03-19: qty 30, 30d supply, fill #0
  Filled 2023-04-15: qty 30, 30d supply, fill #1

## 2023-03-19 NOTE — Addendum Note (Signed)
 Addended by: Joselyn Arrow on: 03/19/2023 01:06 PM   Modules accepted: Orders

## 2023-03-20 ENCOUNTER — Other Ambulatory Visit (HOSPITAL_COMMUNITY): Payer: Self-pay

## 2023-04-06 ENCOUNTER — Ambulatory Visit: Payer: BC Managed Care – PPO | Admitting: Family Medicine

## 2023-04-16 ENCOUNTER — Other Ambulatory Visit: Payer: Self-pay

## 2023-04-16 ENCOUNTER — Other Ambulatory Visit (HOSPITAL_COMMUNITY): Payer: Self-pay

## 2023-04-22 NOTE — Progress Notes (Unsigned)
 Start time: 2:50 End time: 3:16  Virtual Visit via Video Note  I connected with Sharon Beck on 04/23/23 by a video enabled telemedicine application and verified that I am speaking with the correct person using two identifiers.  Location: Patient: home Provider: office   I discussed the limitations of evaluation and management by telemedicine and the availability of in person appointments. The patient expressed understanding and agreed to proceed.  History of Present Illness:  Chief Complaint  Patient presents with   Annual Exam    Follow up for lexapro.    Patient presents to f/u on anxiety. At her January visit, she had been taking buspar, which helped her anxiety, but caused significant side effects. She sent a message in early March stating that despite Buspar helping her anxiety, due to the ongoing light headed/ dizziness/nausea, she was ready to to change medication. She stopped the Buspar, and was started on lexapro (she previously took this, recalled some effect on libido in the past; of note--she reported decreased libido at physical this year, prior to being on any anxiety medications this time).  We sent in rx for lexapro 10mg  tablet and advised to:  start at 1/2 tablet, and STAY at 1/2 tablet for a while (rather than increasing to 10 mg after a week). If tolerating, but no improvement in anxiety, you could increase to the full pill (after a minimum of a week at 1/2 tablet, but you could wait for 3-4 weeks to see a better effect).   She took 1/2 tablet for a week, and increased it to the full tablet. She has been on the full tablet for about 3 weeks. "I think it is working a little bit." She denies side effects. "It makes me sad that I can't take the buspar"--feels a difference in the way the medications work, felt the buspar was more effective. Doesn't feel as well controlled on the lexapro. She is currently on her cycle, which is adding to the irritability. She  doesn't feel as good as she did with the buspar as far as the anxiety. When she stopped taking the buspar, the fog brain lifted.   She currently has a lot of work stress. Is on her cycle now, which may contribute to worsening of moods/irritability.  Needed 1/2 tablet of alprazolam prior to giving a presentatoin she wasn't well prepared for, otherwise hasn't needed any.  Hypertension: Denies dizziness, headaches, chest pain.  Denies side effects of medications. She reports compliance with taking losartan 25mg  daily. BP was 158/86 today, but she forgot her medication this morning. Hasn't been checking BP regularly.    BP Readings from Last 3 Encounters:  02/22/23 124/82  02/01/23 135/79  12/28/22 124/80     PMH, PSH, SH reviewed  Outpatient Encounter Medications as of 04/23/2023  Medication Sig Note   escitalopram (LEXAPRO) 10 MG tablet Take 1/2 tablet by mouth once daily.  Increase to a full tablet after a week (stay at 1/2 tablet longer if needed due to side effects) 04/23/2023: Taking 10 mg for the last 3 weeks   losartan (COZAAR) 25 MG tablet Take 1 tablet (25 mg total) by mouth daily.    Vitamin D, Ergocalciferol, (DRISDOL) 1.25 MG (50000 UNIT) CAPS capsule Take 1 capsule (50,000 Units total) by mouth every 7 (seven) days.    ALPRAZolam (XANAX) 0.5 MG tablet Take 1 tablet (0.5 mg total) by mouth 3 (three) times daily. (Patient not taking: Reported on 04/23/2023) 02/01/2023: Prn, has not needed   [  DISCONTINUED] busPIRone (BUSPAR) 15 MG tablet Take 7.5 mg by mouth in the morning, 10 mg in the evening.  Titrate up as directed to 15 mg twice daily, if needed    No facility-administered encounter medications on file as of 04/23/2023.   Allergies  Allergen Reactions   Aspirin Rash    Pt tolerates ibuprofen    ROS: no headaches, chest pain, shortness of breath.  No f/c or URI symptoms. Anxiety per HPI No depression +intentional weight loss      Observations/Objective:  Wt 148 lb (67.1  kg) Comment: home scale  BMI 24.63 kg/m  Wt Readings from Last 3 Encounters:  04/23/23 148 lb (67.1 kg)  02/22/23 155 lb (70.3 kg)  02/01/23 155 lb (70.3 kg)   Pleasant, well-appearing female, in good spirits. She is alert and oriented. Cranial nerves are grossly intact. Normal mood, affect, grooming, eye contact and speech.     04/23/2023    2:51 PM 02/01/2023    3:52 PM 12/28/2022    8:50 AM 10/22/2020   10:27 AM  GAD 7 : Generalized Anxiety Score  Nervous, Anxious, on Edge 2 1 2 2   Control/stop worrying 0 0 0 2  Worry too much - different things 0 0 1 2  Trouble relaxing 0 0 0 0  Restless 0 1 0 1  Easily annoyed or irritable 3 1 2 2   Afraid - awful might happen 1 0 2 1  Total GAD 7 Score 6 3 7 10   Anxiety Difficulty Somewhat difficult Not difficult at all Somewhat difficult Somewhat difficult      Assessment and Plan:  Essential hypertension - BP elevated today when hadn't taken med. Prev well controlled. Encouraged to monitor BP more regularly.  Generalized anxiety disorder - tolerating lexapro, not as effective as Buspar (which she didn't tolerate). Discussed further titration in 1-2 wks (vs change med, or add low dose buspar)  She picked up 30d refill last week.  Continue with the lexapro at 10 mg daily for another week or two. If anxiety isn't improving, you can further increase to 15 mg (1.5 tablets daily).  You can ultimately increase to 2 pills (20mg ) if needed--either to better control the anxiety, or to see how you tolerate it to make life easier and take one 20 mg tablet rather than having to cut pills. We also briefly discussed adding a very low dose of buspar (2.5 mg) to see if that had any add benefit with perhaps fewer side effects than when you took the higher doses.  If no significant improvement with higher doses of the lexapro (which is an SSRI), we can try an SNRI (effexor, pristiq, cymbalta) instead.  Let us know by the end of the month (before any  auto-refill is done for the lexapro) how you're doing. We may need to change the quantity or the dose of the lexapro, or change the medication entirely.   Follow Up Instructions:    I discussed the assessment and treatment plan with the patient. The patient was provided an opportunity to ask questions and all were answered. The patient agreed with the plan and demonstrated an understanding of the instructions.   The patient was advised to call back or seek an in-person evaluation if the symptoms worsen or if the condition fails to improve as anticipated.  I spent 31 minutes dedicated to the care of this patient, including pre-visit review of records, face to face time, post-visit ordering of testing and documentation.  Lavonda Jumbo, MD

## 2023-04-23 ENCOUNTER — Encounter: Payer: Self-pay | Admitting: Family Medicine

## 2023-04-23 ENCOUNTER — Telehealth (INDEPENDENT_AMBULATORY_CARE_PROVIDER_SITE_OTHER): Admitting: Family Medicine

## 2023-04-23 VITALS — Wt 148.0 lb

## 2023-04-23 DIAGNOSIS — F411 Generalized anxiety disorder: Secondary | ICD-10-CM | POA: Diagnosis not present

## 2023-04-23 DIAGNOSIS — I10 Essential (primary) hypertension: Secondary | ICD-10-CM | POA: Diagnosis not present

## 2023-04-23 NOTE — Patient Instructions (Addendum)
 Continue with the lexapro at 10 mg daily for another week or two. If anxiety isn't improving, you can further increase to 15 mg (1.5 tablets daily).  You can ultimately increase to 2 pills (20mg ) if needed--either to better control the anxiety, or to see how you tolerate it to make life easier and take one 20 mg tablet rather than having to cut pills. We also briefly discussed adding a very low dose of buspar (2.5 mg) to see if that had any add benefit with perhaps fewer side effects than when you took the higher doses.  If no significant improvement with higher doses of the lexapro (which is an SSRI), we can try an SNRI (effexor, pristiq, cymbalta) instead.  Let us know by the end of the month (before any auto-refill is done for the lexapro) how you're doing. We may need to change the quantity or the dose of the lexapro, or change the medication entirely.

## 2023-05-08 ENCOUNTER — Encounter: Payer: Self-pay | Admitting: Family Medicine

## 2023-05-09 ENCOUNTER — Other Ambulatory Visit (HOSPITAL_COMMUNITY): Payer: Self-pay

## 2023-05-09 ENCOUNTER — Other Ambulatory Visit: Payer: Self-pay | Admitting: *Deleted

## 2023-05-09 ENCOUNTER — Other Ambulatory Visit: Payer: Self-pay

## 2023-05-09 MED ORDER — ESCITALOPRAM OXALATE 10 MG PO TABS
15.0000 mg | ORAL_TABLET | Freq: Every day | ORAL | 0 refills | Status: DC
  Filled 2023-05-09: qty 60, 40d supply, fill #0

## 2023-05-31 ENCOUNTER — Other Ambulatory Visit: Payer: Self-pay | Admitting: Internal Medicine

## 2023-05-31 ENCOUNTER — Other Ambulatory Visit (HOSPITAL_COMMUNITY): Payer: Self-pay

## 2023-05-31 MED ORDER — ESCITALOPRAM OXALATE 10 MG PO TABS
20.0000 mg | ORAL_TABLET | Freq: Every day | ORAL | 0 refills | Status: DC
  Filled 2023-05-31 – 2023-06-12 (×2): qty 180, 90d supply, fill #0

## 2023-05-31 NOTE — Progress Notes (Signed)
 Refilled medication.

## 2023-06-01 ENCOUNTER — Other Ambulatory Visit (HOSPITAL_COMMUNITY): Payer: Self-pay

## 2023-06-01 ENCOUNTER — Other Ambulatory Visit: Payer: Self-pay

## 2023-06-12 ENCOUNTER — Other Ambulatory Visit: Payer: Self-pay

## 2023-06-12 ENCOUNTER — Encounter: Payer: Self-pay | Admitting: Pharmacist

## 2023-06-12 ENCOUNTER — Other Ambulatory Visit (HOSPITAL_COMMUNITY): Payer: Self-pay

## 2023-06-12 ENCOUNTER — Encounter: Payer: Self-pay | Admitting: Family Medicine

## 2023-06-12 MED ORDER — SCOPOLAMINE 1 MG/3DAYS TD PT72
1.0000 | MEDICATED_PATCH | TRANSDERMAL | 0 refills | Status: DC
  Filled 2023-06-12 – 2023-06-19 (×2): qty 4, 12d supply, fill #0

## 2023-06-13 ENCOUNTER — Other Ambulatory Visit: Payer: Self-pay

## 2023-06-19 ENCOUNTER — Other Ambulatory Visit (HOSPITAL_COMMUNITY): Payer: Self-pay

## 2023-07-06 ENCOUNTER — Other Ambulatory Visit (HOSPITAL_COMMUNITY): Payer: Self-pay

## 2023-08-23 DIAGNOSIS — L821 Other seborrheic keratosis: Secondary | ICD-10-CM | POA: Diagnosis not present

## 2023-08-23 DIAGNOSIS — D2262 Melanocytic nevi of left upper limb, including shoulder: Secondary | ICD-10-CM | POA: Diagnosis not present

## 2023-08-23 DIAGNOSIS — D2372 Other benign neoplasm of skin of left lower limb, including hip: Secondary | ICD-10-CM | POA: Diagnosis not present

## 2023-09-03 ENCOUNTER — Other Ambulatory Visit: Payer: Self-pay

## 2023-09-03 ENCOUNTER — Other Ambulatory Visit (HOSPITAL_COMMUNITY): Payer: Self-pay

## 2023-09-03 ENCOUNTER — Other Ambulatory Visit: Payer: Self-pay | Admitting: Family Medicine

## 2023-09-03 MED ORDER — ESCITALOPRAM OXALATE 10 MG PO TABS
20.0000 mg | ORAL_TABLET | Freq: Every day | ORAL | 0 refills | Status: DC
  Filled 2023-09-03: qty 180, 90d supply, fill #0

## 2023-09-04 ENCOUNTER — Other Ambulatory Visit: Payer: Self-pay

## 2023-10-01 ENCOUNTER — Other Ambulatory Visit (HOSPITAL_COMMUNITY): Payer: Self-pay

## 2023-10-09 DIAGNOSIS — Z23 Encounter for immunization: Secondary | ICD-10-CM | POA: Diagnosis not present

## 2023-12-03 ENCOUNTER — Other Ambulatory Visit (HOSPITAL_COMMUNITY): Payer: Self-pay

## 2023-12-03 ENCOUNTER — Other Ambulatory Visit: Payer: Self-pay | Admitting: Family Medicine

## 2023-12-03 ENCOUNTER — Other Ambulatory Visit: Payer: Self-pay | Admitting: *Deleted

## 2023-12-03 ENCOUNTER — Other Ambulatory Visit: Payer: Self-pay

## 2023-12-03 MED ORDER — ESCITALOPRAM OXALATE 20 MG PO TABS
20.0000 mg | ORAL_TABLET | Freq: Every day | ORAL | 0 refills | Status: AC
  Filled 2023-12-03: qty 90, 90d supply, fill #0

## 2023-12-03 NOTE — Telephone Encounter (Signed)
 Patient is doing well on the 20mg  and is coming for her CPE 01/09/24. Can we change this to a 20mg  tablet? I spoke with her and she was good with this if you are.

## 2023-12-26 ENCOUNTER — Other Ambulatory Visit: Payer: Self-pay | Admitting: Family Medicine

## 2023-12-26 ENCOUNTER — Other Ambulatory Visit (HOSPITAL_COMMUNITY): Payer: Self-pay

## 2023-12-26 DIAGNOSIS — I10 Essential (primary) hypertension: Secondary | ICD-10-CM

## 2023-12-26 MED ORDER — LOSARTAN POTASSIUM 25 MG PO TABS
25.0000 mg | ORAL_TABLET | Freq: Every day | ORAL | 0 refills | Status: AC
  Filled 2023-12-26: qty 90, 90d supply, fill #0

## 2023-12-27 ENCOUNTER — Other Ambulatory Visit (HOSPITAL_COMMUNITY): Payer: Self-pay

## 2024-01-07 NOTE — Patient Instructions (Addendum)
" °  HEALTH MAINTENANCE RECOMMENDATIONS:  It is recommended that you get at least 30 minutes of aerobic exercise at least 5 days/week (for weight loss, you may need as much as 60-90 minutes). This can be any activity that gets your heart rate up. This can be divided in 10-15 minute intervals if needed, but try and build up your endurance at least once a week.  Weight bearing exercise is also recommended twice weekly.  Eat a healthy diet with lots of vegetables, fruits and fiber.  Colorful foods have a lot of vitamins (ie green vegetables, tomatoes, red peppers, etc).  Limit sweet tea, regular sodas and alcoholic beverages, all of which has a lot of calories and sugar.  Up to 1 alcoholic drink daily may be beneficial for women (unless trying to lose weight, watch sugars).  Drink a lot of water.  Calcium recommendations are 1200-1500 mg daily (1500 mg for postmenopausal women or women without ovaries), and vitamin D  1000 IU daily.  This should be obtained from diet and/or supplements (vitamins), and calcium should not be taken all at once, but in divided doses.  Monthly self breast exams and yearly mammograms for women over the age of 75 is recommended.  Sunscreen of at least SPF 30 should be used on all sun-exposed parts of the skin when outside between the hours of 10 am and 4 pm (not just when at beach or pool, but even with exercise, golf, tennis, and yard work!)  Use a sunscreen that says broad spectrum so it covers both UVA and UVB rays, and make sure to reapply every 1-2 hours.  Remember to change the batteries in your smoke detectors when changing your clock times in the spring and fall. Carbon monoxide detectors are recommended for your home.  Use your seat belt every time you are in a car, and please drive safely and not be distracted with cell phones and texting while driving.  Remember that you need to take a supplement with 1000 IU of vitamin D3 regularly, long-term. You dont need to take  a separate D3 if you end up getting a prescription form (which is short-term). If you take a multivitamin daily with 1000 IU of D3, it is fine to take that with any prescription (if one is needed). Further recommendations will be with your lab results.  Consider a calcium supplement; I encourage you to increase dietary calcium.   "

## 2024-01-07 NOTE — Progress Notes (Unsigned)
 Chief Complaint  Patient presents with   Annual Exam    Fasting annual exam, I will get pap and notes from Dr. Kandyce. Thinks she is in perimenopause, sleep issues, memory, irritable. Has had two migraines in the last month (new). Would like refill on xanax .    Sharon Beck is a 48 y.o. female who presents for a complete physical and follow-up on chronic problems. She sees GYN yearly, last in 01/2023. Doesn't have appointment scheduled yet.  Follow-up on anxiety. She was initially started on buspar  (at her physical in 2024), which helped anxiety, but caused lightheadedness, dizziness, nausea.  She was switched to lexapro  10mg  in 01/2023, and this was increased to 20 mg in April 2025. She denies side effects. Had noted decreased libido prior to starting lexapro . This isn't bothersome or worse. Denies any vaginal dryness or pain with intercourse, just decreased desire. She reports feeling better overall.  She feels overwhelmed right now, worse in the last month.  Prior to this, just gets some anxiety prior to her period.  She hasn't used any xanax  in months.  Last night she thinks she probably should have taken it. She had a little wine instead. She is feeling more irritable now. Having issues with either not sleeping, sleeping too much. LMP was 12/5.  Cycles have been more irregular-- went 60 days without a period, followed by another cycle 20 days later (12/5). Feels like she is PMS-y now (re: irritability and anxiety).  She had night sweats for a short while, none recently (can't recall if it was during the time she missed a period). Previously took xanax  1/2 tablet 2-3x/month, usually the week before her menstrual cycle, or prior to giving presentations at work or leading a group at church, but up until this past month, had been doing very well, and not needing this.   Hypertension: Denies dizziness, chest pain.  Denies side effects of medications. She reports compliance with taking  losartan  25mg  daily.  BP has been running 120-130's/80's, sometimes up to 140/higher 80's.  She reports having 2 migraines recently. She had them when she was younger, not in a long time. She had sharp shooting pain, worse leaning forward, associated nausea, photo/phono-sensitivity. She took ibuprofen , tylenol , and slept for 12 hours. The second migraine was similar, but caught it earlier. Felt better after an hour of sleep, tylenol  and ibuprofen .  BP Readings from Last 3 Encounters:  01/09/24 122/80  02/22/23 124/82  02/01/23 135/79   Vitamin D  deficiency: She took vitamin D3 regularly for a couple of months after her physical last year, when level was low. She then saw an orthopedist who put her on prescription D for 3 months. She never resumed OTC D3.  Hasn't taken any supplement for about 6 months.  Component Ref Range & Units (hover) 1 yr ago 3 yr ago 8 yr ago  Vit D, 25-Hydroxy 27.3 Low  31.7 CM 33 R, CM   Family history of cerebral aneurysms.  Her sister passed away at the age of 48 due to ruptured aneurysm (also had HTN, smoker), and reportedly also has an aunt who had the same (in her late 27's).  She had normal MRI/MRA in 11/2020.   Immunization History  Administered Date(s) Administered   Influenza,inj,Quad PF,6+ Mos 11/19/2019   Influenza-Unspecified 10/05/2015, 09/30/2016, 09/20/2017, 10/17/2018, 11/05/2020, 10/28/2021, 12/08/2022, 10/17/2023   PFIZER(Purple Top)SARS-COV-2 Vaccination 01/28/2019, 02/18/2019, 12/23/2019   Pfizer Covid-19 Vaccine Bivalent Booster 64yrs & up 10/22/2020   Tdap 03/12/2014   Declines  COVID booste. Last Pap smear: 01/2018; last GYN visit was 01/2023, had pap. No result received Last mammogram: 01/2023 Last colonoscopy: 02/2022, focal lymphoid aggregates, hyperplastic polyp. 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  F/u colonoscopy rec 5 years d/t family history. Dentist: every 6 months Ophtho:  yearly Exercise: Walks the dog 4-5x/week, for 20-30 minutes. No weight-bearing exercise. Has a treadmill and elliptical at home, as well as weights.  Calcium--occasional yogurt, occasional cheese stick, some cheese on a salad, doesn't drink milk.   Vitamin D -OH 31.7 11/2020  Lipid screen: Lab Results  Component Value Date   CHOL 223 (H) 12/29/2022   HDL 59 12/29/2022   LDLCALC 143 (H) 12/29/2022   TRIG 119 12/29/2022   CHOLHDL 3.8 12/29/2022   Thyroid screen: Lab Results  Component Value Date   TSH 1.840 11/19/2019   Other providers: Dr. Ebbie- surgeon [facility] - Dr. Kandyce Dorn Flatten at [facility]  [facility]  Derm: Dr. Bonney   PMH, PSH, [facility] and FH were reviewed and updated. H/o GDM, HTN  FH thyroid disease, cerebral aneurysms, colon CA, HTN, DM, HLD  Outpatient Encounter Medications as of 01/09/2024  Medication Sig Note   escitalopram  (LEXAPRO ) 20 MG tablet Take 1 tablet (20 mg total) by mouth daily.    losartan  (COZAAR ) 25 MG tablet Take 1 tablet (25 mg total) by mouth daily.    [DISCONTINUED] ALPRAZolam  (XANAX ) 0.5 MG tablet Take 1 tablet (0.5 mg total) by mouth 3 (three) times daily. 01/09/2024: As needed   [DISCONTINUED] Vitamin D , Ergocalciferol , (DRISDOL ) 1.25 MG (50000 UNIT) CAPS capsule Take 1 capsule (50,000 Units total) by mouth every 7 (seven) days.    ALPRAZolam  (XANAX ) 0.5 MG tablet Take 1 tablet (0.5 mg total) by mouth 3 (three) times daily. 01/09/2024: Uses 1/2 tablet very rarely   [DISCONTINUED] scopolamine  (TRANSDERM SCOP , 1.5 MG,) 1 MG/3DAYS Place 1 patch (1.5 mg total) onto the skin every 3 (three) days.    No facility-administered encounter medications on file as of 01/09/2024.   Allergies  Allergen Reactions   Aspirin Rash    Pt tolerates ibuprofen      ROS:  The patient denies anorexia, fever, weight changes, vision changes, decreased hearing, ear pain, sore throat, breast concerns, chest pain, palpitations,  dizziness, syncope, dyspnea on exertion, cough, swelling, nausea, vomiting, diarrhea, constipation, abdominal pain, melena, hematochezia, indigestion/heartburn, hematuria, incontinence, dysuria, irregular menstrual cycles, vaginal discharge, odor or itch, genital lesions, joint pains, numbness, tingling, weakness, tremor, suspicious skin lesions, abnormal bleeding/bruising, or enlarged lymph nodes.  Anxiety and decreased libido, per HPI. Occasional skipped beat, notices at night only. Infrequent Denies any joint pains. Irritability, irregular menses, fatigue as per HPI. 2 migraines within the last 3 months, per HPI.   PHYSICAL EXAM:  BP 122/80   Pulse 72   Ht 5' 2 (1.575 m)   Wt 159 lb (72.1 kg)   LMP 12/21/2023   BMI 29.08 kg/m   Wt Readings from Last 3 Encounters:  01/09/24 159 lb (72.1 kg)  04/23/23 148 lb (67.1 kg)  02/22/23 155 lb (70.3 kg)  12/28/23 158 lb 12.8 oz (72 kg)   General Appearance:    Alert, cooperative, no distress, appears stated age. She is not undressed for today's exam.  Head:    Normocephalic, without obvious abnormality, atraumatic  Eyes:    PERRL, conjunctiva/corneas clear, EOM's intact  Ears:    Normal TM's and external ear canals  Nose:   No  drainage or sinus tenderness  Throat:   Normal mucosa  Neck:   Supple, no lymphadenopathy;  thyroid:  no enlargement/ tenderness/nodules; no JVD  Back:    Spine nontender, no curvature, ROM normal, no CVA  tenderness  Lungs:     Clear to auscultation bilaterally without wheezes, rales or   ronchi; respirations unlabored  Chest Wall:    No tenderness or deformity   Heart:    Regular rate and rhythm, S1 and S2 normal, no murmur, rub   or gallop  Breast Exam:    Deferred to OB/GYN  Abdomen:     Soft, non-tender, nondistended, normoactive bowel sounds,    no masses, no hepatosplenomegaly  Genitalia:    Deferred to OB/GYN       Extremities:   No clubbing, cyanosis or edema.   Pulses:   2+ and symmetric all  extremities  Skin:   Skin color, texture, turgor normal, no rashes or lesions.   Lymph nodes:   Cervical, supraclavicular nodes normal  Neurologic:   Normal strength, sensation and gait; reflexes 2+ and symmetric throughout                               Psych:   Normal mood, affect, hygiene and grooming.       01/09/2024    8:36 AM 12/05/2021    8:37 AM 11/24/2020    3:07 PM 10/22/2020   10:27 AM 11/18/2018    8:33 AM  Depression screen PHQ 2/9  Decreased Interest 0 0 0 0 0  Down, Depressed, Hopeless 0 0 0 0 0  PHQ - 2 Score 0 0 0 0 0  Altered sleeping 2      Tired, decreased energy 2      Change in appetite 0      Feeling bad or failure about yourself  0      Trouble concentrating 3      Moving slowly or fidgety/restless 2      Suicidal thoughts 0      PHQ-9 Score 9      Difficult doing work/chores Not difficult at all          01/09/2024    8:39 AM 04/23/2023    2:51 PM 02/01/2023    3:52 PM 12/28/2022    8:50 AM  GAD 7 : Generalized Anxiety Score  Nervous, Anxious, on Edge 2 2 1 2   Control/stop worrying 0 0 0 0  Worry too much - different things 0 0 0 1  Trouble relaxing 2 0 0 0  Restless 0 0 1 0  Easily annoyed or irritable 2 3 1 2   Afraid - awful might happen 0 1 0 2  Total GAD 7 Score 6 6 3 7   Anxiety Difficulty Not difficult at all Somewhat difficult Not difficult at all Somewhat difficult      ASSESSMENT/PLAN:    Please get pap from 01/2023 (we got mammo, but not any notes from GYN or pap results)   Discussed monthly self breast exams and yearly mammograms; at least 30 minutes of aerobic activity at least 5 days/week, weight-bearing exercise at least 2x/week; proper sunscreen use reviewed; healthy diet, including goals of calcium and vitamin D  intake and alcohol recommendations (less than or equal to 1 drink/day) reviewed; regular seatbelt use; changing batteries in smoke detectors.  Immunization recommendations discussed--continue yearly flu shots.   TdaP  recommended, declined today, prefers to return  for [place]  Declined COVID booster Colonoscopy recommendations reviewed, UTD, due 02/2027  CPE 1 year

## 2024-01-08 ENCOUNTER — Encounter: Payer: Self-pay | Admitting: *Deleted

## 2024-01-09 ENCOUNTER — Ambulatory Visit: Payer: BC Managed Care – PPO | Admitting: Family Medicine

## 2024-01-09 ENCOUNTER — Encounter: Payer: Self-pay | Admitting: Family Medicine

## 2024-01-09 ENCOUNTER — Other Ambulatory Visit: Payer: Self-pay

## 2024-01-09 ENCOUNTER — Other Ambulatory Visit (HOSPITAL_COMMUNITY): Payer: Self-pay

## 2024-01-09 VITALS — BP 122/80 | HR 72 | Ht 62.0 in | Wt 159.0 lb

## 2024-01-09 DIAGNOSIS — E7800 Pure hypercholesterolemia, unspecified: Secondary | ICD-10-CM | POA: Diagnosis not present

## 2024-01-09 DIAGNOSIS — I10 Essential (primary) hypertension: Secondary | ICD-10-CM | POA: Diagnosis not present

## 2024-01-09 DIAGNOSIS — Z0000 Encounter for general adult medical examination without abnormal findings: Secondary | ICD-10-CM | POA: Diagnosis not present

## 2024-01-09 DIAGNOSIS — E559 Vitamin D deficiency, unspecified: Secondary | ICD-10-CM

## 2024-01-09 DIAGNOSIS — F411 Generalized anxiety disorder: Secondary | ICD-10-CM

## 2024-01-09 DIAGNOSIS — N926 Irregular menstruation, unspecified: Secondary | ICD-10-CM

## 2024-01-09 DIAGNOSIS — Z23 Encounter for immunization: Secondary | ICD-10-CM | POA: Diagnosis not present

## 2024-01-09 DIAGNOSIS — R5383 Other fatigue: Secondary | ICD-10-CM | POA: Diagnosis not present

## 2024-01-09 MED ORDER — ALPRAZOLAM 0.5 MG PO TABS
0.5000 mg | ORAL_TABLET | Freq: Three times a day (TID) | ORAL | 0 refills | Status: AC
  Filled 2024-01-09: qty 30, 10d supply, fill #0

## 2024-01-10 LAB — CBC WITH DIFFERENTIAL/PLATELET
Basophils Absolute: 0 x10E3/uL (ref 0.0–0.2)
Basos: 1 %
EOS (ABSOLUTE): 0.2 x10E3/uL (ref 0.0–0.4)
Eos: 5 %
Hematocrit: 44.3 % (ref 34.0–46.6)
Hemoglobin: 14.7 g/dL (ref 11.1–15.9)
Immature Grans (Abs): 0 x10E3/uL (ref 0.0–0.1)
Immature Granulocytes: 0 %
Lymphocytes Absolute: 1.6 x10E3/uL (ref 0.7–3.1)
Lymphs: 34 %
MCH: 32.2 pg (ref 26.6–33.0)
MCHC: 33.2 g/dL (ref 31.5–35.7)
MCV: 97 fL (ref 79–97)
Monocytes Absolute: 0.3 x10E3/uL (ref 0.1–0.9)
Monocytes: 6 %
Neutrophils Absolute: 2.6 x10E3/uL (ref 1.4–7.0)
Neutrophils: 54 %
Platelets: 275 x10E3/uL (ref 150–450)
RBC: 4.57 x10E6/uL (ref 3.77–5.28)
RDW: 12.4 % (ref 11.7–15.4)
WBC: 4.8 x10E3/uL (ref 3.4–10.8)

## 2024-01-10 LAB — CMP14+EGFR
ALT: 24 IU/L (ref 0–32)
AST: 20 IU/L (ref 0–40)
Albumin: 4.9 g/dL (ref 3.9–4.9)
Alkaline Phosphatase: 64 IU/L (ref 41–116)
BUN/Creatinine Ratio: 15 (ref 9–23)
BUN: 11 mg/dL (ref 6–24)
Bilirubin Total: 0.7 mg/dL (ref 0.0–1.2)
CO2: 21 mmol/L (ref 20–29)
Calcium: 9.3 mg/dL (ref 8.7–10.2)
Chloride: 104 mmol/L (ref 96–106)
Creatinine, Ser: 0.71 mg/dL (ref 0.57–1.00)
Globulin, Total: 2.5 g/dL (ref 1.5–4.5)
Glucose: 90 mg/dL (ref 70–99)
Potassium: 4.6 mmol/L (ref 3.5–5.2)
Sodium: 138 mmol/L (ref 134–144)
Total Protein: 7.4 g/dL (ref 6.0–8.5)
eGFR: 106 mL/min/1.73

## 2024-01-10 LAB — LIPID PANEL
Chol/HDL Ratio: 3.7 ratio (ref 0.0–4.4)
Cholesterol, Total: 231 mg/dL — ABNORMAL HIGH (ref 100–199)
HDL: 62 mg/dL
LDL Chol Calc (NIH): 143 mg/dL — ABNORMAL HIGH (ref 0–99)
Triglycerides: 149 mg/dL (ref 0–149)
VLDL Cholesterol Cal: 26 mg/dL (ref 5–40)

## 2024-01-10 LAB — VITAMIN D 25 HYDROXY (VIT D DEFICIENCY, FRACTURES): Vit D, 25-Hydroxy: 26.1 ng/mL — AB (ref 30.0–100.0)

## 2024-01-10 LAB — TSH: TSH: 1.41 u[IU]/mL (ref 0.450–4.500)

## 2024-01-11 ENCOUNTER — Ambulatory Visit: Payer: Self-pay | Admitting: Family Medicine

## 2024-01-11 ENCOUNTER — Other Ambulatory Visit: Payer: Self-pay

## 2024-01-11 ENCOUNTER — Other Ambulatory Visit (HOSPITAL_BASED_OUTPATIENT_CLINIC_OR_DEPARTMENT_OTHER): Payer: Self-pay

## 2024-01-11 DIAGNOSIS — E559 Vitamin D deficiency, unspecified: Secondary | ICD-10-CM

## 2024-01-11 DIAGNOSIS — I10 Essential (primary) hypertension: Secondary | ICD-10-CM

## 2024-01-11 DIAGNOSIS — E7800 Pure hypercholesterolemia, unspecified: Secondary | ICD-10-CM

## 2024-01-11 MED ORDER — VITAMIN D (ERGOCALCIFEROL) 1.25 MG (50000 UNIT) PO CAPS
50000.0000 [IU] | ORAL_CAPSULE | ORAL | 0 refills | Status: AC
  Filled 2024-01-11: qty 8, 56d supply, fill #0

## 2024-01-25 ENCOUNTER — Other Ambulatory Visit (INDEPENDENT_AMBULATORY_CARE_PROVIDER_SITE_OTHER)

## 2024-01-25 DIAGNOSIS — Z23 Encounter for immunization: Secondary | ICD-10-CM

## 2024-01-25 NOTE — Progress Notes (Signed)
 Patient is in office today for a nurse visit for Immunization. Patient Injection was given in the  Right deltoid. Patient tolerated injection well.

## 2024-01-29 ENCOUNTER — Ambulatory Visit (HOSPITAL_BASED_OUTPATIENT_CLINIC_OR_DEPARTMENT_OTHER)
Admission: RE | Admit: 2024-01-29 | Discharge: 2024-01-29 | Disposition: A | Discharge status: Home / Self Care | Payer: Self-pay | Source: Ambulatory Visit | Attending: Family Medicine | Admitting: Family Medicine

## 2024-01-29 ENCOUNTER — Ambulatory Visit: Payer: Self-pay | Admitting: Family Medicine

## 2024-01-29 DIAGNOSIS — I10 Essential (primary) hypertension: Secondary | ICD-10-CM | POA: Insufficient documentation

## 2024-01-29 DIAGNOSIS — E7800 Pure hypercholesterolemia, unspecified: Secondary | ICD-10-CM | POA: Insufficient documentation

## 2024-01-31 ENCOUNTER — Other Ambulatory Visit: Payer: Self-pay | Admitting: *Deleted

## 2024-01-31 DIAGNOSIS — E7800 Pure hypercholesterolemia, unspecified: Secondary | ICD-10-CM

## 2024-04-30 ENCOUNTER — Other Ambulatory Visit

## 2025-01-12 ENCOUNTER — Encounter: Admitting: Family Medicine

## 2025-01-28 ENCOUNTER — Encounter: Admitting: Family Medicine
